# Patient Record
Sex: Male | Born: 1950
Health system: Southern US, Community
[De-identification: ages and names within clinical notes are randomized; demographics above are authoritative.]

## PROBLEM LIST (undated history)

## (undated) DIAGNOSIS — K635 Polyp of colon: Secondary | ICD-10-CM

## (undated) DIAGNOSIS — J45909 Unspecified asthma, uncomplicated: Secondary | ICD-10-CM

## (undated) DIAGNOSIS — J302 Other seasonal allergic rhinitis: Secondary | ICD-10-CM

## (undated) DIAGNOSIS — E291 Testicular hypofunction: Secondary | ICD-10-CM

## (undated) DIAGNOSIS — E669 Obesity, unspecified: Secondary | ICD-10-CM

## (undated) DIAGNOSIS — T148XXA Other injury of unspecified body region, initial encounter: Secondary | ICD-10-CM

## (undated) DIAGNOSIS — E559 Vitamin D deficiency, unspecified: Secondary | ICD-10-CM

## (undated) DIAGNOSIS — J309 Allergic rhinitis, unspecified: Secondary | ICD-10-CM

## (undated) DIAGNOSIS — N400 Enlarged prostate without lower urinary tract symptoms: Secondary | ICD-10-CM

## (undated) DIAGNOSIS — N529 Male erectile dysfunction, unspecified: Secondary | ICD-10-CM

## (undated) DIAGNOSIS — H905 Unspecified sensorineural hearing loss: Secondary | ICD-10-CM

## (undated) DIAGNOSIS — M8580 Other specified disorders of bone density and structure, unspecified site: Secondary | ICD-10-CM

## (undated) DIAGNOSIS — E785 Hyperlipidemia, unspecified: Secondary | ICD-10-CM

## (undated) DIAGNOSIS — G4730 Sleep apnea, unspecified: Secondary | ICD-10-CM

## (undated) DIAGNOSIS — I10 Essential (primary) hypertension: Secondary | ICD-10-CM

## (undated) DIAGNOSIS — T7840XA Allergy, unspecified, initial encounter: Secondary | ICD-10-CM

## (undated) HISTORY — PX: ACHILLES TENDON SURGERY: SHX542

## (undated) HISTORY — PX: TONSILLECTOMY: SUR1361

## (undated) HISTORY — PX: COLONOSCOPY: SHX174

## (undated) HISTORY — DX: Male erectile dysfunction, unspecified: N52.9

## (undated) HISTORY — PX: POLYPECTOMY: SHX149

## (undated) HISTORY — DX: Other injury of unspecified body region, initial encounter: T14.8XXA

## (undated) HISTORY — DX: Unspecified asthma, uncomplicated: J45.909

## (undated) HISTORY — DX: Other specified disorders of bone density and structure, unspecified site: M85.80

## (undated) HISTORY — DX: Unspecified sensorineural hearing loss: H90.5

## (undated) HISTORY — DX: Polyp of colon: K63.5

## (undated) HISTORY — DX: Hyperlipidemia, unspecified: E78.5

## (undated) HISTORY — DX: Sleep apnea, unspecified: G47.30

## (undated) HISTORY — DX: Other seasonal allergic rhinitis: J30.2

## (undated) HISTORY — DX: Benign prostatic hyperplasia without lower urinary tract symptoms: N40.0

## (undated) HISTORY — DX: Obesity, unspecified: E66.9

## (undated) HISTORY — DX: Allergy, unspecified, initial encounter: T78.40XA

## (undated) HISTORY — DX: Testicular hypofunction: E29.1

## (undated) HISTORY — DX: Vitamin D deficiency, unspecified: E55.9

## (undated) HISTORY — DX: Essential (primary) hypertension: I10

## (undated) HISTORY — DX: Allergic rhinitis, unspecified: J30.9

---

## 1996-07-02 HISTORY — PX: UMBILICAL HERNIA REPAIR: SHX196

## 1998-12-02 ENCOUNTER — Emergency Department (HOSPITAL_COMMUNITY): Admission: EM | Admit: 1998-12-02 | Discharge: 1998-12-02 | Payer: Self-pay | Admitting: Emergency Medicine

## 1999-03-27 ENCOUNTER — Ambulatory Visit: Admission: RE | Admit: 1999-03-27 | Discharge: 1999-03-27 | Payer: Self-pay | Admitting: Family Medicine

## 2007-10-21 ENCOUNTER — Ambulatory Visit (HOSPITAL_BASED_OUTPATIENT_CLINIC_OR_DEPARTMENT_OTHER): Admission: RE | Admit: 2007-10-21 | Discharge: 2007-10-21 | Payer: Self-pay | Admitting: *Deleted

## 2007-10-25 ENCOUNTER — Ambulatory Visit: Payer: Self-pay | Admitting: Internal Medicine

## 2009-10-10 ENCOUNTER — Ambulatory Visit (HOSPITAL_COMMUNITY): Admission: RE | Admit: 2009-10-10 | Discharge: 2009-10-10 | Payer: Self-pay | Admitting: Sports Medicine

## 2009-10-26 ENCOUNTER — Encounter: Admission: RE | Admit: 2009-10-26 | Discharge: 2009-10-26 | Payer: Self-pay | Admitting: Sports Medicine

## 2010-07-23 ENCOUNTER — Encounter: Payer: Self-pay | Admitting: Sports Medicine

## 2010-11-14 NOTE — Procedures (Signed)
NAME:  Eric Campbell, Eric Campbell               ACCOUNT NO.:  [id number]   MEDICAL RECORD NO.:  [id number]          PATIENT TYPE:  OUT   LOCATION:  SLEEP CENTER                 FACILITY:  [facility]   PHYSICIAN:  Clinton D. Maple Hudson, MD, FCCP, FACPDATE OF BIRTH:  07-17-1948   DATE OF STUDY:  10/21/2007                            NOCTURNAL POLYSOMNOGRAM   REFERRING PHYSICIAN:  Rosine Abe, M.D.   REFERRING PHYSICIAN:  Rosine Abe, M.D.   INDICATION FOR STUDY:  Hypersomnia with sleep apnea.   EPWORTH SLEEPINESS SCORE:  12/24.   BMI:  39.5.   WEIGHT:  260 pounds.   HEIGHT:  68 inches.   NECK:  18 inches.   MEDICATIONS:  Home medications charted and reviewed.   SLEEP ARCHITECTURE:  Split study protocol.  During the diagnostic phase,  total sleep time was 120 minutes with sleep efficiency 89.2%.  Stage 1  was 7.1%, stage 2 was 92.9%, stage 3 absent, REM absent.  Sleep latency  4 minutes.  Awake after sleep onset 10.5 minutes.  Arousal index 29.5.  Bedtime medication:  Ambien CR taken at 2145 hours.   RESPIRATORY DATA:  Split study protocol.  Apnea/hypopnea index (AHI) 47  per hour, indicating moderately severe obstructive sleep apnea/hypopnea  syndrome before CPAP.  Ninety-four events were counted, including 17  obstructive apneas, 5 central apneas, 3 mixed apneas, and 69 hypopneas  before CPAP.  Events were more common while supine but seen in all sleep  positions.  CPAP was titrated to 21 CWP.  Effective control was seen at  17 CWP, AHI 0 per hour.  The technician tried higher pressures and  briefly tried BiPAP with an inspiratory pressure of 23 over an  expiratory pressure of 19 in an effort to suppress snoring.  Best  pressure appeared to be 19 CWP with an AHI of 0 per hour and a well-  maintained oxygen saturation.  He chose a medium Mirage Quattro full-  face mask with heated humidifier.   OXYGEN DATA:  Very loud snoring with oxygen desaturation to a nadir of  83% before CPAP.   After CPAP control, saturation held 94.5% on room air.   CARDIAC DATA:  Sinus rhythm with occasional PVCs.   MOVEMENT-PARASOMNIA:  No significant movement disturbance.  Bathroom x1.   IMPRESSIONS-RECOMMENDATIONS:  1. Moderately severe obstructive sleep apnea/hypopnea syndrome, AHI 47      per hour.  A few central events were noted.  Events were more      frequent while supine as expected but not limited to supine sleep      position.  Very loud snoring with oxygen desaturation to a nadir of      83%.  2. Recommend initial home trial of CPAP at 19 CWP, which gave an AHI      of 0 per hour during this study.  He chose a medium ResMed  Quattro      full-face mask with heated humidifier.      Clinton D. Maple Hudson, MD, FCCP, FACP  Diplomate, Biomedical engineer of Sleep Medicine  Electronically Signed     CDY/MEDQ  D:  10/25/2007 08:38:53  T:  10/25/2007  16:10:96  Job:  [id number]

## 2011-08-23 ENCOUNTER — Other Ambulatory Visit: Payer: Self-pay | Admitting: Podiatry

## 2011-08-23 DIAGNOSIS — M7660 Achilles tendinitis, unspecified leg: Secondary | ICD-10-CM

## 2011-08-28 ENCOUNTER — Ambulatory Visit
Admission: RE | Admit: 2011-08-28 | Discharge: 2011-08-28 | Disposition: A | Discharge status: Home / Self Care | Payer: BC Managed Care – PPO | Source: Ambulatory Visit | Attending: Podiatry | Admitting: Podiatry

## 2011-08-28 DIAGNOSIS — M7660 Achilles tendinitis, unspecified leg: Secondary | ICD-10-CM

## 2011-09-07 ENCOUNTER — Ambulatory Visit (AMBULATORY_SURGERY_CENTER): Payer: BC Managed Care – PPO | Admitting: *Deleted

## 2011-09-07 ENCOUNTER — Encounter: Payer: Self-pay | Admitting: Gastroenterology

## 2011-09-07 VITALS — Ht 68.0 in | Wt 271.3 lb

## 2011-09-07 DIAGNOSIS — Z1211 Encounter for screening for malignant neoplasm of colon: Secondary | ICD-10-CM

## 2011-09-07 MED ORDER — PEG-KCL-NACL-NASULF-NA ASC-C 100 G PO SOLR: ORAL | Status: DC

## 2011-09-21 ENCOUNTER — Other Ambulatory Visit: Payer: Self-pay | Admitting: Gastroenterology

## 2011-10-24 ENCOUNTER — Encounter: Payer: Self-pay | Admitting: Gastroenterology

## 2011-10-24 ENCOUNTER — Ambulatory Visit (AMBULATORY_SURGERY_CENTER): Payer: BC Managed Care – PPO | Admitting: Gastroenterology

## 2011-10-24 VITALS — BP 135/71 | HR 61 | Temp 95.7°F | Resp 18 | Ht 68.0 in | Wt 271.0 lb

## 2011-10-24 DIAGNOSIS — D179 Benign lipomatous neoplasm, unspecified: Secondary | ICD-10-CM

## 2011-10-24 DIAGNOSIS — D126 Benign neoplasm of colon, unspecified: Secondary | ICD-10-CM

## 2011-10-24 DIAGNOSIS — Z1211 Encounter for screening for malignant neoplasm of colon: Secondary | ICD-10-CM

## 2011-10-24 DIAGNOSIS — D175 Benign lipomatous neoplasm of intra-abdominal organs: Secondary | ICD-10-CM

## 2011-10-24 MED ORDER — SODIUM CHLORIDE 0.9 % IV SOLN: 500.0000 mL | INTRAVENOUS | Status: DC

## 2011-10-24 NOTE — Progress Notes (Signed)
Patient did not experience any of the following events: a burn prior to discharge; a fall within the facility; wrong site/side/patient/procedure/implant event; or a hospital transfer or hospital admission upon discharge from the facility. (G8907) Patient did not have preoperative order for IV antibiotic SSI prophylaxis. (G8918)  

## 2011-10-24 NOTE — Patient Instructions (Signed)

## 2011-10-24 NOTE — Op Note (Signed)
[facility] [address] [place], [place]  [address]  COLONOSCOPY PROCEDURE REPORT PATIENT:  Eric Campbell, Eric Campbell  MR#:  [id number] BIRTHDATE:  11-23-1950, 60 yrs. old  GENDER:  male ENDOSCOPIST:  Judie Petit T. Russella Dar, MD, [facility] Referred by:  Tally Joe, M.D. PROCEDURE DATE:  10/24/2011 PROCEDURE:  Colonoscopy with biopsy ASA CLASS:  Class II INDICATIONS:  1) Routine Risk Screening MEDICATIONS:   MAC sedation, administered by CRNA, propofol (Diprivan) 200 mg IV DESCRIPTION OF PROCEDURE:   After the risks benefits and alternatives of the procedure were thoroughly explained, informed consent was obtained.  Digital rectal exam was performed and revealed no abnormalities.   The LB CF-[id number] endoscope was introduced through the anus and advanced to the cecum, which was identified by both the appendix and ileocecal valve, without limitations.  The quality of the prep was good, using MoviPrep. The instrument was then slowly withdrawn as the colon was fully examined. <<PROCEDUREIMAGES>> FINDINGS:  A lipoma was found at the hepatic flexure. It was soft, smooth and submucosal. It was 1.5 cm in size. Multiple biopsies were obtained and sent to pathology.  A sessile polyp was found in the descending colon. It was 5 mm in size. The polyp was removed using cold biopsy forceps.  Mild diverticulosis was found in the sigmoid colon.  Otherwise normal colonoscopy without other polyps, masses, vascular ectasias, or inflammatory changes.   Retroflexed views in the rectum revealed no abnormalities.    The time to cecum =  1.25  minutes. The scope was then withdrawn (time = 11.25  min) from the patient and the procedure completed.  COMPLICATIONS:  None  ENDOSCOPIC IMPRESSION: 1) 1.5 cm lipoma at the hepatic flexure 2) 5 mm sessile polyp in the descending colon 3) Mild diverticulosis in the sigmoid colon  RECOMMENDATIONS: 1) Await pathology results 2) High fiber diet with liberal fluid  intake. 3) If the polyp is adenomatous (pre-cancerous), repeat colonoscopy in 5 years. Otherwise follow colorectal cancer screening guidelines for "routine risk" patients with colonoscopy in 10 years.  Venita Lick. Russella Dar, MD, Clementeen Graham  n. eSIGNED:   Venita Lick. Charmion Hapke at 10/24/2011 09:54 AM  Lyman Bishop, Windy Fast, [phone]

## 2011-10-25 ENCOUNTER — Telehealth: Payer: Self-pay

## 2011-10-25 NOTE — Telephone Encounter (Signed)
Left message on answering machine. 

## 2011-10-29 ENCOUNTER — Encounter: Payer: Self-pay | Admitting: Gastroenterology

## 2014-10-01 ENCOUNTER — Institutional Professional Consult (permissible substitution): Payer: Self-pay | Admitting: Cardiology

## 2014-10-05 ENCOUNTER — Other Ambulatory Visit: Payer: Self-pay

## 2014-10-07 ENCOUNTER — Ambulatory Visit (INDEPENDENT_AMBULATORY_CARE_PROVIDER_SITE_OTHER): Payer: 59 | Admitting: Cardiology

## 2014-10-07 ENCOUNTER — Encounter: Payer: Self-pay | Admitting: Cardiology

## 2014-10-07 VITALS — BP 124/80 | HR 78 | Ht 68.0 in | Wt 265.1 lb

## 2014-10-07 DIAGNOSIS — E669 Obesity, unspecified: Secondary | ICD-10-CM | POA: Diagnosis not present

## 2014-10-07 DIAGNOSIS — G4733 Obstructive sleep apnea (adult) (pediatric): Secondary | ICD-10-CM

## 2014-10-07 DIAGNOSIS — G479 Sleep disorder, unspecified: Secondary | ICD-10-CM | POA: Diagnosis not present

## 2014-10-07 DIAGNOSIS — R351 Nocturia: Secondary | ICD-10-CM | POA: Insufficient documentation

## 2014-10-07 DIAGNOSIS — Z6837 Body mass index (BMI) 37.0-37.9, adult: Secondary | ICD-10-CM | POA: Insufficient documentation

## 2014-10-07 NOTE — Patient Instructions (Addendum)
Your physician wants you to follow-up in: 6 months with Dr Radford Pax. (October 2016).  You will receive a reminder letter in the mail two months in advance. If you don't receive a letter, please call our office to schedule the follow-up appointment.   You have been referred to [facility].

## 2014-10-07 NOTE — Progress Notes (Signed)
Cardiology Office Note   Date:  10/07/2014   ID:  [name], DOB [birth year], MRN [id number]  PCP:  Gara Kroner, MD    Chief Complaint  Patient presents with  . Sleep Apnea  . Obesity      History of Present Illness: Eric Campbell is a 64 y.o. male who presents for followup of his OSA. He has a history of OSA and is on CPAP. He uses the nasal pillow mask which he tolerates well.  He does not use a chin strap.  He uses [facility] for his supplies.  He only gets 2-3 hours nightly.  He wakes up a lot during the night every 3 hours to go to the bathroom and sometimes has a hard time getting back to sleep.  He feels sleepy some in the am but he says that he is so used to this routine that he does not have lot of fatigue during the day.  If he is off from work he will nap during the day.  He is at 19cm H2O on his device.  He has some problems with mouth dryness.  He does not think that he snores.  He does not get any type of aerobic exercise due to lack of time.  He works 12 hour days.      Past Medical History  Diagnosis Date  . Hyperlipidemia   . Seasonal allergies   . Sleep apnea     cpap  . Diabetes mellitus     diet controlled  . Torn ligament     left ankle  . Asthma   . Sleep apnea   . Asthma   . Dyslipidemia   . Congenital hearing loss   . Obesity   . ED (erectile dysfunction)   . Vitamin D deficiency   . Hypogonadism male   . BPH (benign prostatic hyperplasia)     Past Surgical History  Procedure Laterality Date  . Umbilical hernia repair  1998     Current Outpatient Prescriptions  Medication Sig Dispense Refill  . albuterol (PROVENTIL HFA;VENTOLIN HFA) 108 (90 BASE) MCG/ACT inhaler Inhale 2 puffs into the lungs every 4 (four) hours as needed for wheezing or shortness of breath.    Marland Kitchen aspirin 81 MG tablet Take 81 mg by mouth daily.    Marland Kitchen atorvastatin (LIPITOR) 20 MG tablet Take 20 mg by mouth 1 day or 1 dose.    . Calcium Carbonate 1500 (600 CA) MG TABS  Take by mouth.    . losartan (COZAAR) 25 MG tablet Take 25 mg by mouth daily.    . metFORMIN (GLUCOPHAGE) 500 MG tablet Take 500 mg by mouth 2 (two) times daily with a meal.    . sildenafil (VIAGRA) 100 MG tablet Take 100 mg by mouth daily as needed for erectile dysfunction.    . TESTIM 50 MG/5GM (1%) GEL Apply 2 application topically daily.     No current facility-administered medications for this visit.    Allergies:   Penicillins and Lotrisone    Social History:  The patient  reports that he has never smoked. He has never used smokeless tobacco. He reports that he does not drink alcohol or use illicit drugs.   Family History:  The patient's family history includes Pancreatic cancer (age of onset: 1) in his mother. There is no history of Colon cancer, Stomach cancer, or Rectal cancer.    ROS:  Please see the history of present illness.   Otherwise,  review of systems are positive for none.   All other systems are reviewed and negative.    PHYSICAL EXAM: VS:  BP 124/80 mmHg  Pulse 78  Ht 5\' 8"  (1.727 m)  Wt 265 lb 1.9 oz (120.258 kg)  BMI 40.32 kg/m2  SpO2 93% , BMI Body mass index is 40.32 kg/(m^2). GEN: Well nourished, well developed, in no acute distress HEENT: normal Neck: no JVD, carotid bruits, or masses Cardiac: RRR; no murmurs, rubs, or gallops,no edema  Respiratory:  clear to auscultation bilaterally, normal work of breathing GI: soft, nontender, nondistended, + BS MS: no deformity or atrophy Skin: warm and dry, no rash Neuro:  Strength and sensation are intact Psych: euthymic mood, full affect   EKG:  EKG is not ordered today.    Recent Labs: No results found for requested labs within last 365 days.    Lipid Panel No results found for: CHOL, TRIG, HDL, CHOLHDL, VLDL, LDLCALC, LDLDIRECT    Wt Readings from Last 3 Encounters:  10/07/14 265 lb 1.9 oz (120.258 kg)  10/24/11 271 lb (122.925 kg)  09/07/11 271 lb 4.8 oz (123.061 kg)       ASSESSMENT AND  PLAN:  1.  Obstructive sleep apnea on CPAP at 19cm H2O.  He tolerates it well but is awakening a lot at night .  I will get a download from his DME to make sure OSA is adequately treated. 2.  Obesity - I have encouraged him to try to get into some type of routine exercise program and get into a routine diet.   3.  Sleep maintenance disorder that sounds mainly like issues with his prostate.  He is having to get up a lot at night to urinate.  I will refer him to urology.   4.  Nocturia   Current medicines are reviewed at length with the patient today.  The patient does not have concerns regarding medicines.  The following changes have been made:  no change  Labs/ tests ordered today include: see above assessment and plan  Orders Placed This Encounter  Procedures  . Ambulatory referral to Urology     Disposition:   FU with  me in 6 months   Signed, Sueanne Margarita, MD  10/07/2014 1:30 PM    [facility] [place], [place],   [address] Phone: [phone]; Fax: [phone]

## 2014-10-20 ENCOUNTER — Encounter: Payer: Self-pay | Admitting: Cardiology

## 2014-10-29 ENCOUNTER — Telehealth: Payer: Self-pay | Admitting: Cardiology

## 2014-10-29 NOTE — Telephone Encounter (Signed)
New Message    Patient needs a call back about forms that were sent to the office for medical equipment. Please give patient a call back.

## 2014-10-29 NOTE — Telephone Encounter (Signed)
Patient concerned because [facility] told him that paperwork had been faxed over twice for Dr. Theodosia Blender signature.  Informed patient that paperwork was not received, but [facility] would be called to follow-up. Patient requests call from [facility] once I speak with them.  Called [facility] and spoke with Ivin Booty. They had the incorrect fax number. Provided correct fax number and Ivin Booty st she will send the paperwork.  Instructed her to call patient to schedule appointment for next week. Cleta Alberts for her help.

## 2014-11-02 ENCOUNTER — Telehealth: Payer: Self-pay | Admitting: Cardiology

## 2014-11-02 NOTE — Telephone Encounter (Signed)
New Message   Follow up on the form that was faxed on Thursday 11/02/2014 . Making sure that it was signed and faxed back for CPAP supplies.

## 2014-11-02 NOTE — Telephone Encounter (Signed)
Informed Eric Campbell that paper work to be signed was received this morning, but Dr. Radford Pax is not here to sign. Informed her that paperwork will be sent ASAP.

## 2014-11-30 ENCOUNTER — Ambulatory Visit: Payer: 59 | Admitting: Podiatry

## 2014-12-14 ENCOUNTER — Ambulatory Visit: Payer: 59 | Admitting: Podiatry

## 2014-12-28 ENCOUNTER — Encounter: Payer: Self-pay | Admitting: Podiatry

## 2014-12-28 ENCOUNTER — Ambulatory Visit (INDEPENDENT_AMBULATORY_CARE_PROVIDER_SITE_OTHER): Payer: 59

## 2014-12-28 ENCOUNTER — Ambulatory Visit (INDEPENDENT_AMBULATORY_CARE_PROVIDER_SITE_OTHER): Payer: 59 | Admitting: Podiatry

## 2014-12-28 DIAGNOSIS — E1142 Type 2 diabetes mellitus with diabetic polyneuropathy: Secondary | ICD-10-CM | POA: Diagnosis not present

## 2014-12-28 DIAGNOSIS — M778 Other enthesopathies, not elsewhere classified: Secondary | ICD-10-CM

## 2014-12-28 DIAGNOSIS — R52 Pain, unspecified: Secondary | ICD-10-CM

## 2014-12-28 DIAGNOSIS — L600 Ingrowing nail: Secondary | ICD-10-CM | POA: Diagnosis not present

## 2014-12-28 DIAGNOSIS — M7751 Other enthesopathy of right foot: Secondary | ICD-10-CM | POA: Diagnosis not present

## 2014-12-28 DIAGNOSIS — M779 Enthesopathy, unspecified: Secondary | ICD-10-CM

## 2014-12-28 NOTE — Progress Notes (Signed)
He presents today with a chief complaint of a painful first metatarsophalangeal joint right foot. He denies any trauma. He does relate that he has started to develop some odd sensations to both great toes bilaterally he denies any problems sleeping at night however he is also complaining of pain along the tibial border of the hallux right. He relates he is recently started a new diabetic medication. He states that his blood sugars have not been very good.  Objective: Vital signs are stable he is alert and oriented 3 pulses are palpable bilateral. Neurologic sensorium is diminished per Semmes-Weinstein monofilament. Deep tendon reflexes are intact bilateral muscle strength equal bilaterally. Orthopedic evaluation demonstrates all joints distal to the ankle range of motion without crepitation. With exception of the first metatarsophalangeal joint of the right foot. Radiographs demonstrate joint space narrowing and subchondral sclerosis and dorsal spurring. There is pain on dorsiflexion. Sharp incurvated nail margin along the tibial border of the hallux right which does not appear to be complicated by infection at this point.  Assessment: Diabetic peripheral neuropathy with capsulitis and hallux limitus first metatarsophalangeal joint right foot. Ingrown nail hallux right.  Plan: Discussed etiology pathology conservative versus surgical therapies. At this point after sterile Betadine skin prep I injected the first metatarsophalangeal joint of the right foot. I debrided his hallux nail without complications. More than likely he will need to start Lyrica or gabapentin in the near future. I will follow-up with him in a few weeks to make sure he is doing better.

## 2015-02-02 ENCOUNTER — Encounter: Payer: Self-pay | Admitting: Cardiology

## 2015-05-31 ENCOUNTER — Ambulatory Visit (INDEPENDENT_AMBULATORY_CARE_PROVIDER_SITE_OTHER): Payer: 59 | Admitting: Podiatry

## 2015-05-31 ENCOUNTER — Encounter: Payer: Self-pay | Admitting: Podiatry

## 2015-05-31 VITALS — BP 131/67 | HR 83 | Resp 16

## 2015-05-31 DIAGNOSIS — L600 Ingrowing nail: Secondary | ICD-10-CM | POA: Diagnosis not present

## 2015-05-31 MED ORDER — NEOMYCIN-POLYMYXIN-HC 3.5-10000-1 OT SOLN: OTIC | Status: DC

## 2015-05-31 NOTE — Patient Instructions (Signed)

## 2015-05-31 NOTE — Progress Notes (Signed)
He presents today with a chief complaint of a painful ingrown toenail tibial border of the hallux right and present for some quite some time. He states that he is followed and his blood sugar down low enough that we can perform a procedure.  Objective evaluation reveals vital signs stable alert and oriented 3. Hemoglobin A1c is a 6.9%. Pulses are strongly palpable right foot. Neurologic sensorium is intact. Deep tendon reflexes are intact. Muscle strength is 5 over 5 dorsiflexion plantar flexors and inverters everters on his musculature is intact. Orthopedic evaluation demonstrates no changes from previous findings of them worsening of the first metatarsophalangeal joint osteoarthritic change. Cutaneous evaluation demonstrated erythema and edema with sharp incurvated nail margin tibial border of the hallux right.  Assessment: Ingrown nail paronychia abscess hallux right. Diabetes mellitus with diabetic peripheral neuropathy. 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  Plan: Discussed etiology pathology conservative versus surgical therapies. At this point we performed a chemical matrixectomy to the tibial border of the hallux right. He tolerated this procedure well today and we discussed the need for surgical intervention regarding the osteoarthritis of the first metatarsophalangeal joint. He was given both oral and home going instructions for care and soaking of his right toe as well as a prescription for her for an otic to be applied twice a day after soaking. I will follow-up with him in 1 week.

## 2015-06-06 ENCOUNTER — Encounter: Payer: Self-pay | Admitting: Podiatry

## 2015-06-06 ENCOUNTER — Ambulatory Visit (INDEPENDENT_AMBULATORY_CARE_PROVIDER_SITE_OTHER): Payer: 59 | Admitting: Podiatry

## 2015-06-06 DIAGNOSIS — L600 Ingrowing nail: Secondary | ICD-10-CM

## 2015-06-06 DIAGNOSIS — L03031 Cellulitis of right toe: Secondary | ICD-10-CM

## 2015-06-06 DIAGNOSIS — L03011 Cellulitis of right finger: Secondary | ICD-10-CM

## 2015-06-06 MED ORDER — CLINDAMYCIN HCL 150 MG PO CAPS: 150.0000 mg | ORAL_CAPSULE | Freq: Three times a day (TID) | ORAL | Status: DC

## 2015-06-06 NOTE — Patient Instructions (Signed)

## 2015-06-06 NOTE — Progress Notes (Signed)
He presents today 1 week status post matrixectomy hallux right. He states that his continues to soak on a regular basis in the toe is a little bit red. He denies fever chills nausea vomiting muscle pains.  Objective: Vital signs stable alert and oriented 3. Pulses are strongly palpable. Neurologic sensorium is intact. Deep tendon reflexes are intact. Mild erythema along the proximal nail fold of the hallux right no purulence and no malodor.  Assessment: Well-healing matrixectomy hallux right. Mild paronychia.   Plan: Started him on clindamycin 300 mg 1 by mouth 3 times a day.

## 2015-06-07 ENCOUNTER — Ambulatory Visit: Payer: 59 | Admitting: Podiatry

## 2015-06-12 NOTE — Progress Notes (Signed)
Cardiology Office Note   Date:  06/13/2015   ID:  [name], DOB [birth year], MRN [id number]  PCP:  Gara Kroner, MD    Chief Complaint  Patient presents with  . Sleep Apnea      History of Present Illness: Eric Campbell is a 64 y.o. male who presents for followup of his OSA. He has a history of OSA and is on CPAP. He uses the nasal pillow mask which he tolerates well. He does not use a chin strap.He would like to get a new CPAP machine.  He only gets 2-3 hours nightly. He wakes up a lot during the night every 3 hours to go to the bathroom and sometimes has a hard time getting back to sleep. He feels sleepy some in the am but he says that he is so used to this routine that he does not have lot of fatigue during the day. If he is off from work he will nap during the day. He is at 19cm H2O on his device. He has some problems with mouth dryness. He does not think that he snores. He does not get any type of aerobic exercise due to lack of time. He works 12 hour days.      Past Medical History  Diagnosis Date  . Hyperlipidemia   . Seasonal allergies   . Sleep apnea     cpap  . Diabetes mellitus     diet controlled  . Torn ligament     left ankle  . Asthma   . Sleep apnea   . Asthma   . Dyslipidemia   . Congenital hearing loss   . Obesity   . ED (erectile dysfunction)   . Vitamin D deficiency   . Hypogonadism male   . BPH (benign prostatic hyperplasia)     Past Surgical History  Procedure Laterality Date  . Umbilical hernia repair  1998     Current Outpatient Prescriptions  Medication Sig Dispense Refill  . albuterol (PROVENTIL HFA;VENTOLIN HFA) 108 (90 BASE) MCG/ACT inhaler Inhale 2 puffs into the lungs every 4 (four) hours as needed for wheezing or shortness of breath.    Marland Kitchen aspirin 81 MG tablet Take 81 mg by mouth daily.    Marland Kitchen atorvastatin (LIPITOR) 20 MG tablet Take 20 mg by mouth 1 day or 1 dose.    . Calcium Carbonate 1500  (600 CA) MG TABS Take by mouth.    . clindamycin (CLEOCIN) 150 MG capsule Take 1 capsule (150 mg total) by mouth 3 (three) times daily. 30 capsule 0  . losartan (COZAAR) 25 MG tablet Take 25 mg by mouth daily.    . metFORMIN (GLUCOPHAGE) 500 MG tablet Take 500 mg by mouth 2 (two) times daily with a meal.    . neomycin-polymyxin-hydrocortisone (CORTISPORIN) otic solution Apply one to two drops to toe after soaking twice daily. 10 mL 0  . sildenafil (VIAGRA) 100 MG tablet Take 100 mg by mouth daily as needed for erectile dysfunction.    . TESTIM 50 MG/5GM (1%) GEL Apply 2 application topically daily.     No current facility-administered medications for this visit.    Allergies:   Penicillins and Lotrisone    Social History:  The patient  reports that he has never smoked. He has never used smokeless tobacco. He reports that he does not drink alcohol or use illicit drugs.  Family History:  The patient's family history includes Pancreatic cancer (age of onset: 65) in his mother. There is no history of Colon cancer, Stomach cancer, or Rectal cancer.    ROS:  Please see the history of present illness.   Otherwise, review of systems are positive for none.   All other systems are reviewed and negative.    PHYSICAL EXAM: VS:  BP 128/78 mmHg  Pulse 68  Ht 5\' 8"  (1.727 m)  Wt 268 lb (121.564 kg)  BMI 40.76 kg/m2 , BMI Body mass index is 40.76 kg/(m^2). GEN: Well nourished, well developed, in no acute distress HEENT: normal Neck: no JVD, carotid bruits, or masses Cardiac: RRR; no murmurs, rubs, or gallops,no edema  Respiratory:  clear to auscultation bilaterally, normal work of breathing GI: soft, nontender, nondistended, + BS MS: no deformity or atrophy Skin: warm and dry, no rash Neuro:  Strength and sensation are intact Psych: euthymic mood, full affect   EKG:  EKG is not ordered today.    Recent Labs: No results found for requested labs within last 365 days.    Lipid Panel No  results found for: CHOL, TRIG, HDL, CHOLHDL, VLDL, LDLCALC, LDLDIRECT    Wt Readings from Last 3 Encounters:  06/13/15 268 lb (121.564 kg)  10/07/14 265 lb 1.9 oz (120.258 kg)  10/24/11 271 lb (122.925 kg)     ASSESSMENT AND PLAN:  1. Obstructive sleep apnea on CPAP at 19cm H2O. He tolerates it well but is awakening a lot at night . I will get a download from his DME to make sure OSA is adequately treated. I will also order a new machine since his is over 38 years old.  2. Obesity - I have encouraged him to try to get into some type of routine exercise program and get into a routine diet.      Current medicines are reviewed at length with the patient today.  The patient does not have concerns regarding medicines.  The following changes have been made:  no change  Labs/ tests ordered today: See above Assessment and Plan No orders of the defined types were placed in this encounter.     Disposition:   FU with me in 1 year  Signed, Sueanne Margarita, MD  06/13/2015 8:36 AM    [facility] [place], [place], [place]  [address] Phone: [phone]; Fax: [phone]

## 2015-06-13 ENCOUNTER — Ambulatory Visit: Payer: 59 | Admitting: Podiatry

## 2015-06-13 ENCOUNTER — Encounter: Payer: Self-pay | Admitting: Cardiology

## 2015-06-13 ENCOUNTER — Ambulatory Visit (INDEPENDENT_AMBULATORY_CARE_PROVIDER_SITE_OTHER): Payer: 59 | Admitting: Cardiology

## 2015-06-13 VITALS — BP 128/78 | HR 68 | Ht 68.0 in | Wt 268.0 lb

## 2015-06-13 DIAGNOSIS — G4733 Obstructive sleep apnea (adult) (pediatric): Secondary | ICD-10-CM | POA: Diagnosis not present

## 2015-06-13 DIAGNOSIS — E669 Obesity, unspecified: Secondary | ICD-10-CM | POA: Diagnosis not present

## 2015-06-13 NOTE — Patient Instructions (Signed)
Medication Instructions:  Your physician recommends that you continue on your current medications as directed. Please refer to the Current Medication list given to you today.   Labwork: None Ordered   Testing/Procedures: Talbert Cage, CMA is submitting the order for the new CPAP Her phone number is [phone]   Follow-Up: Your physician wants you to follow-up in: 1 year with Dr. Radford Pax.  You will receive a reminder letter in the mail two months in advance. If you don't receive a letter, please call our office to schedule the follow-up appointment.   If you need a refill on your cardiac medications before your next appointment, please call your pharmacy.

## 2015-06-13 NOTE — Addendum Note (Signed)
Addended by: Andres Ege on: 06/13/2015 03:59 PM   Modules accepted: Orders

## 2015-06-20 ENCOUNTER — Ambulatory Visit: Payer: 59 | Admitting: Podiatry

## 2015-07-22 DIAGNOSIS — G4733 Obstructive sleep apnea (adult) (pediatric): Secondary | ICD-10-CM | POA: Diagnosis not present

## 2015-07-27 DIAGNOSIS — L986 Other infiltrative disorders of the skin and subcutaneous tissue: Secondary | ICD-10-CM | POA: Diagnosis not present

## 2015-07-27 DIAGNOSIS — D485 Neoplasm of uncertain behavior of skin: Secondary | ICD-10-CM | POA: Diagnosis not present

## 2015-08-22 DIAGNOSIS — G4733 Obstructive sleep apnea (adult) (pediatric): Secondary | ICD-10-CM | POA: Diagnosis not present

## 2015-08-23 DIAGNOSIS — E291 Testicular hypofunction: Secondary | ICD-10-CM | POA: Diagnosis not present

## 2015-08-23 DIAGNOSIS — M8580 Other specified disorders of bone density and structure, unspecified site: Secondary | ICD-10-CM | POA: Diagnosis not present

## 2015-08-23 DIAGNOSIS — E559 Vitamin D deficiency, unspecified: Secondary | ICD-10-CM | POA: Diagnosis not present

## 2015-08-23 DIAGNOSIS — G4730 Sleep apnea, unspecified: Secondary | ICD-10-CM | POA: Diagnosis not present

## 2015-08-23 DIAGNOSIS — Z7984 Long term (current) use of oral hypoglycemic drugs: Secondary | ICD-10-CM | POA: Diagnosis not present

## 2015-08-23 DIAGNOSIS — E782 Mixed hyperlipidemia: Secondary | ICD-10-CM | POA: Diagnosis not present

## 2015-08-23 DIAGNOSIS — E1165 Type 2 diabetes mellitus with hyperglycemia: Secondary | ICD-10-CM | POA: Diagnosis not present

## 2015-08-23 DIAGNOSIS — J45909 Unspecified asthma, uncomplicated: Secondary | ICD-10-CM | POA: Diagnosis not present

## 2015-08-23 DIAGNOSIS — G4700 Insomnia, unspecified: Secondary | ICD-10-CM | POA: Diagnosis not present

## 2015-08-23 DIAGNOSIS — J309 Allergic rhinitis, unspecified: Secondary | ICD-10-CM | POA: Diagnosis not present

## 2015-08-23 DIAGNOSIS — N529 Male erectile dysfunction, unspecified: Secondary | ICD-10-CM | POA: Diagnosis not present

## 2015-08-30 ENCOUNTER — Encounter: Payer: Self-pay | Admitting: Cardiology

## 2015-09-19 DIAGNOSIS — G4733 Obstructive sleep apnea (adult) (pediatric): Secondary | ICD-10-CM | POA: Diagnosis not present

## 2015-10-17 ENCOUNTER — Encounter: Payer: Self-pay | Admitting: Cardiology

## 2015-10-17 ENCOUNTER — Ambulatory Visit (INDEPENDENT_AMBULATORY_CARE_PROVIDER_SITE_OTHER): Payer: PPO | Admitting: Cardiology

## 2015-10-17 VITALS — BP 126/84 | HR 68 | Ht 68.5 in | Wt 267.1 lb

## 2015-10-17 DIAGNOSIS — E669 Obesity, unspecified: Secondary | ICD-10-CM

## 2015-10-17 DIAGNOSIS — G4733 Obstructive sleep apnea (adult) (pediatric): Secondary | ICD-10-CM

## 2015-10-17 NOTE — Progress Notes (Signed)
Cardiology Office Note    Date:  10/17/2015   ID:  Eric Campbell, DOB 10/21/1950, MRN [id number]  PCP:  Gara Kroner, MD  Cardiologist:  Sueanne Margarita, MD   Chief Complaint  Patient presents with  . Sleep Apnea    History of Present Illness:  Eric Campbell is a 65 y.o. male who presents for followup of his OSA. He has a history of OSA and is on CPAP. He uses the nasal pillow mask which he tolerates well.  He feels the pressure is good.  He is getting about 7 hours of sleep a night but gets up some to go to the bathroom.   If he gets 5 hours of sleep he says he feels rested in the am.  He occasionally naps during the day and uses his CPAP for naps.  He is at 18cm H2O on his device. He has some problems with mouth dryness but much improved on his new CPAP device. He does not think that he snores. He just started exercising at the gym because his HbA1C was elevated.  Past Medical History  Diagnosis Date  . Hyperlipidemia   . Seasonal allergies   . Sleep apnea     cpap  . Diabetes mellitus     diet controlled  . Torn ligament     left ankle  . Asthma   . Sleep apnea   . Asthma   . Dyslipidemia   . Congenital hearing loss   . Obesity   . ED (erectile dysfunction)   . Vitamin D deficiency   . Hypogonadism male   . BPH (benign prostatic hyperplasia)     Past Surgical History  Procedure Laterality Date  . Umbilical hernia repair  1998    Current Medications: Outpatient Prescriptions Prior to Visit  Medication Sig Dispense Refill  . albuterol (PROVENTIL HFA;VENTOLIN HFA) 108 (90 BASE) MCG/ACT inhaler Inhale 2 puffs into the lungs every 4 (four) hours as needed for wheezing or shortness of breath.    Marland Kitchen aspirin 81 MG tablet Take 81 mg by mouth daily.    Marland Kitchen atorvastatin (LIPITOR) 20 MG tablet Take 20 mg by mouth daily.     . Calcium Carbonate 1500 (600 CA) MG TABS Take 1 tablet by mouth daily.     Marland Kitchen losartan (COZAAR) 25 MG tablet Take 25 mg by mouth daily.    .  metFORMIN (GLUCOPHAGE) 500 MG tablet Take 500 mg by mouth 2 (two) times daily with a meal.    . sildenafil (VIAGRA) 100 MG tablet Take 100 mg by mouth daily as needed for erectile dysfunction.    . TESTIM 50 MG/5GM (1%) GEL Apply 2 application topically daily.    . clindamycin (CLEOCIN) 150 MG capsule Take 1 capsule (150 mg total) by mouth 3 (three) times daily. (Patient not taking: Reported on 10/17/2015) 30 capsule 0  . neomycin-polymyxin-hydrocortisone (CORTISPORIN) otic solution Apply one to two drops to toe after soaking twice daily. (Patient not taking: Reported on 10/17/2015) 10 mL 0   No facility-administered medications prior to visit.     Allergies:   Penicillins and Lotrisone   Social History   Social History  . Marital Status: Married    Spouse Name: donna  . Number of Children: 3  . Years of Education: college   Occupational History  . [employer]    Social History Main Topics  . Smoking status: Never Smoker   . Smokeless tobacco: Never Used  .  Alcohol Use: No  . Drug Use: No  . Sexual Activity: Not Asked   Other Topics Concern  . None   Social History Narrative   Tobacco use cigarettes: Never smoked. Tobacco history last upated 10/27/2013 no smoking Alcohol : Rare>> No Recreational drug use.     Family History:  The patient's family history includes Pancreatic cancer (age of onset: 55) in his mother. There is no history of Colon cancer, Stomach cancer, or Rectal cancer.   ROS:   Please see the history of present illness.    ROS All other systems reviewed and are negative.   PHYSICAL EXAM:   VS:  BP 126/84 mmHg  Pulse 68  Ht 5' 8.5" (1.74 m)  Wt 267 lb 1.9 oz (121.165 kg)  BMI 40.02 kg/m2   GEN: Well nourished, well developed, in no acute distress HEENT: normal Neck: no JVD, carotid bruits, or masses Cardiac: RRR; no murmurs, rubs, or gallops,no edema.  Intact distal pulses bilaterally.  Respiratory:  clear to auscultation bilaterally, normal  work of breathing GI: soft, nontender, nondistended, + BS MS: no deformity or atrophy Skin: warm and dry, no rash Neuro:  Alert and Oriented x 3, Strength and sensation are intact Psych: euthymic mood, full affect  Wt Readings from Last 3 Encounters:  10/17/15 267 lb 1.9 oz (121.165 kg)  06/13/15 268 lb (121.564 kg)  10/07/14 265 lb 1.9 oz (120.258 kg)      Studies/Labs Reviewed:   EKG:  EKG is not ordered today.   Recent Labs: No results found for requested labs within last 365 days.   Lipid Panel No results found for: CHOL, TRIG, HDL, CHOLHDL, VLDL, LDLCALC, LDLDIRECT  Additional studies/ records that were reviewed today include:  CPAP d/l    ASSESSMENT:    1. OSA (obstructive sleep apnea)   2. Obesity (BMI 30-39.9)      PLAN:  In order of problems listed above:  1. OSA - he has gotten a new CPAP machine which he is tolerating well.  His d.l today showed an AHI of 2.9/hrn on 18cm H2O and 90% compliant in using more than 4 hours nightly.  He will continue on current CPAP settings.  2. Obesity - I encouraged him to get into a routine exercise program and cut back on carbs and portions.      Medication Adjustments/Labs and Tests Ordered: Current medicines are reviewed at length with the patient today.  Concerns regarding medicines are outlined above.  Medication changes, Labs and Tests ordered today are listed in the Patient Instructions below. There are no Patient Instructions on file for this visit.   Lurena Nida, MD  10/17/2015 9:51 AM    [address], [place], [place]  [address] Phone: [phone]; Fax: [phone]

## 2015-10-17 NOTE — Patient Instructions (Signed)

## 2015-10-20 DIAGNOSIS — G4733 Obstructive sleep apnea (adult) (pediatric): Secondary | ICD-10-CM | POA: Diagnosis not present

## 2015-10-28 ENCOUNTER — Encounter: Payer: Self-pay | Admitting: Cardiology

## 2015-11-19 DIAGNOSIS — G4733 Obstructive sleep apnea (adult) (pediatric): Secondary | ICD-10-CM | POA: Diagnosis not present

## 2015-12-02 DIAGNOSIS — H2513 Age-related nuclear cataract, bilateral: Secondary | ICD-10-CM | POA: Diagnosis not present

## 2015-12-02 DIAGNOSIS — H52203 Unspecified astigmatism, bilateral: Secondary | ICD-10-CM | POA: Diagnosis not present

## 2015-12-02 DIAGNOSIS — E119 Type 2 diabetes mellitus without complications: Secondary | ICD-10-CM | POA: Diagnosis not present

## 2015-12-20 DIAGNOSIS — G4733 Obstructive sleep apnea (adult) (pediatric): Secondary | ICD-10-CM | POA: Diagnosis not present

## 2015-12-26 DIAGNOSIS — E1165 Type 2 diabetes mellitus with hyperglycemia: Secondary | ICD-10-CM | POA: Diagnosis not present

## 2015-12-26 DIAGNOSIS — J45909 Unspecified asthma, uncomplicated: Secondary | ICD-10-CM | POA: Diagnosis not present

## 2015-12-26 DIAGNOSIS — E782 Mixed hyperlipidemia: Secondary | ICD-10-CM | POA: Diagnosis not present

## 2015-12-26 DIAGNOSIS — Z1389 Encounter for screening for other disorder: Secondary | ICD-10-CM | POA: Diagnosis not present

## 2015-12-26 DIAGNOSIS — G4730 Sleep apnea, unspecified: Secondary | ICD-10-CM | POA: Diagnosis not present

## 2015-12-26 DIAGNOSIS — G4700 Insomnia, unspecified: Secondary | ICD-10-CM | POA: Diagnosis not present

## 2015-12-26 DIAGNOSIS — M8580 Other specified disorders of bone density and structure, unspecified site: Secondary | ICD-10-CM | POA: Diagnosis not present

## 2015-12-26 DIAGNOSIS — E559 Vitamin D deficiency, unspecified: Secondary | ICD-10-CM | POA: Diagnosis not present

## 2015-12-26 DIAGNOSIS — E291 Testicular hypofunction: Secondary | ICD-10-CM | POA: Diagnosis not present

## 2015-12-26 DIAGNOSIS — J309 Allergic rhinitis, unspecified: Secondary | ICD-10-CM | POA: Diagnosis not present

## 2015-12-26 DIAGNOSIS — L309 Dermatitis, unspecified: Secondary | ICD-10-CM | POA: Diagnosis not present

## 2015-12-26 DIAGNOSIS — N529 Male erectile dysfunction, unspecified: Secondary | ICD-10-CM | POA: Diagnosis not present

## 2016-01-11 DIAGNOSIS — G4733 Obstructive sleep apnea (adult) (pediatric): Secondary | ICD-10-CM | POA: Diagnosis not present

## 2016-01-19 DIAGNOSIS — G4733 Obstructive sleep apnea (adult) (pediatric): Secondary | ICD-10-CM | POA: Diagnosis not present

## 2016-02-19 DIAGNOSIS — G4733 Obstructive sleep apnea (adult) (pediatric): Secondary | ICD-10-CM | POA: Diagnosis not present

## 2016-03-21 DIAGNOSIS — G4733 Obstructive sleep apnea (adult) (pediatric): Secondary | ICD-10-CM | POA: Diagnosis not present

## 2016-04-20 DIAGNOSIS — G4733 Obstructive sleep apnea (adult) (pediatric): Secondary | ICD-10-CM | POA: Diagnosis not present

## 2016-04-23 DIAGNOSIS — N529 Male erectile dysfunction, unspecified: Secondary | ICD-10-CM | POA: Diagnosis not present

## 2016-04-23 DIAGNOSIS — E559 Vitamin D deficiency, unspecified: Secondary | ICD-10-CM | POA: Diagnosis not present

## 2016-04-23 DIAGNOSIS — R2990 Unspecified symptoms and signs involving the nervous system: Secondary | ICD-10-CM | POA: Diagnosis not present

## 2016-04-23 DIAGNOSIS — E1165 Type 2 diabetes mellitus with hyperglycemia: Secondary | ICD-10-CM | POA: Diagnosis not present

## 2016-04-23 DIAGNOSIS — E782 Mixed hyperlipidemia: Secondary | ICD-10-CM | POA: Diagnosis not present

## 2016-04-23 DIAGNOSIS — G4730 Sleep apnea, unspecified: Secondary | ICD-10-CM | POA: Diagnosis not present

## 2016-04-23 DIAGNOSIS — J309 Allergic rhinitis, unspecified: Secondary | ICD-10-CM | POA: Diagnosis not present

## 2016-04-23 DIAGNOSIS — J45909 Unspecified asthma, uncomplicated: Secondary | ICD-10-CM | POA: Diagnosis not present

## 2016-04-23 DIAGNOSIS — E291 Testicular hypofunction: Secondary | ICD-10-CM | POA: Diagnosis not present

## 2016-04-23 DIAGNOSIS — G4700 Insomnia, unspecified: Secondary | ICD-10-CM | POA: Diagnosis not present

## 2016-04-23 DIAGNOSIS — Z23 Encounter for immunization: Secondary | ICD-10-CM | POA: Diagnosis not present

## 2016-04-23 DIAGNOSIS — M8580 Other specified disorders of bone density and structure, unspecified site: Secondary | ICD-10-CM | POA: Diagnosis not present

## 2016-04-24 DIAGNOSIS — G4733 Obstructive sleep apnea (adult) (pediatric): Secondary | ICD-10-CM | POA: Diagnosis not present

## 2016-04-30 ENCOUNTER — Other Ambulatory Visit: Payer: Self-pay | Admitting: Family Medicine

## 2016-04-30 DIAGNOSIS — R2990 Unspecified symptoms and signs involving the nervous system: Secondary | ICD-10-CM

## 2016-05-03 ENCOUNTER — Ambulatory Visit
Admission: RE | Admit: 2016-05-03 | Discharge: 2016-05-03 | Disposition: A | Discharge status: Home / Self Care | Payer: PPO | Source: Ambulatory Visit | Attending: Family Medicine | Admitting: Family Medicine

## 2016-05-03 DIAGNOSIS — R4781 Slurred speech: Secondary | ICD-10-CM | POA: Diagnosis not present

## 2016-05-03 DIAGNOSIS — R2990 Unspecified symptoms and signs involving the nervous system: Secondary | ICD-10-CM

## 2016-05-21 DIAGNOSIS — G4733 Obstructive sleep apnea (adult) (pediatric): Secondary | ICD-10-CM | POA: Diagnosis not present

## 2016-06-20 DIAGNOSIS — G4733 Obstructive sleep apnea (adult) (pediatric): Secondary | ICD-10-CM | POA: Diagnosis not present

## 2016-07-21 DIAGNOSIS — G4733 Obstructive sleep apnea (adult) (pediatric): Secondary | ICD-10-CM | POA: Diagnosis not present

## 2016-08-09 DIAGNOSIS — G4733 Obstructive sleep apnea (adult) (pediatric): Secondary | ICD-10-CM | POA: Diagnosis not present

## 2016-08-21 DIAGNOSIS — E782 Mixed hyperlipidemia: Secondary | ICD-10-CM | POA: Diagnosis not present

## 2016-08-21 DIAGNOSIS — M8580 Other specified disorders of bone density and structure, unspecified site: Secondary | ICD-10-CM | POA: Diagnosis not present

## 2016-08-21 DIAGNOSIS — N529 Male erectile dysfunction, unspecified: Secondary | ICD-10-CM | POA: Diagnosis not present

## 2016-08-21 DIAGNOSIS — G4700 Insomnia, unspecified: Secondary | ICD-10-CM | POA: Diagnosis not present

## 2016-08-21 DIAGNOSIS — J45909 Unspecified asthma, uncomplicated: Secondary | ICD-10-CM | POA: Diagnosis not present

## 2016-08-21 DIAGNOSIS — E1165 Type 2 diabetes mellitus with hyperglycemia: Secondary | ICD-10-CM | POA: Diagnosis not present

## 2016-08-21 DIAGNOSIS — G4730 Sleep apnea, unspecified: Secondary | ICD-10-CM | POA: Diagnosis not present

## 2016-08-21 DIAGNOSIS — E559 Vitamin D deficiency, unspecified: Secondary | ICD-10-CM | POA: Diagnosis not present

## 2016-08-21 DIAGNOSIS — I10 Essential (primary) hypertension: Secondary | ICD-10-CM | POA: Diagnosis not present

## 2016-08-21 DIAGNOSIS — E291 Testicular hypofunction: Secondary | ICD-10-CM | POA: Diagnosis not present

## 2016-08-21 DIAGNOSIS — Z7984 Long term (current) use of oral hypoglycemic drugs: Secondary | ICD-10-CM | POA: Diagnosis not present

## 2016-09-20 DIAGNOSIS — M7582 Other shoulder lesions, left shoulder: Secondary | ICD-10-CM | POA: Diagnosis not present

## 2016-09-20 DIAGNOSIS — M7581 Other shoulder lesions, right shoulder: Secondary | ICD-10-CM | POA: Diagnosis not present

## 2016-12-10 DIAGNOSIS — L309 Dermatitis, unspecified: Secondary | ICD-10-CM | POA: Diagnosis not present

## 2016-12-10 DIAGNOSIS — Z7984 Long term (current) use of oral hypoglycemic drugs: Secondary | ICD-10-CM | POA: Diagnosis not present

## 2016-12-10 DIAGNOSIS — E1165 Type 2 diabetes mellitus with hyperglycemia: Secondary | ICD-10-CM | POA: Diagnosis not present

## 2016-12-10 DIAGNOSIS — J302 Other seasonal allergic rhinitis: Secondary | ICD-10-CM | POA: Diagnosis not present

## 2016-12-10 DIAGNOSIS — I10 Essential (primary) hypertension: Secondary | ICD-10-CM | POA: Diagnosis not present

## 2016-12-20 DIAGNOSIS — L308 Other specified dermatitis: Secondary | ICD-10-CM | POA: Diagnosis not present

## 2016-12-20 DIAGNOSIS — L821 Other seborrheic keratosis: Secondary | ICD-10-CM | POA: Diagnosis not present

## 2017-01-22 DIAGNOSIS — H11152 Pinguecula, left eye: Secondary | ICD-10-CM | POA: Diagnosis not present

## 2017-01-22 DIAGNOSIS — H2513 Age-related nuclear cataract, bilateral: Secondary | ICD-10-CM | POA: Diagnosis not present

## 2017-01-22 DIAGNOSIS — E119 Type 2 diabetes mellitus without complications: Secondary | ICD-10-CM | POA: Diagnosis not present

## 2017-01-22 DIAGNOSIS — H5203 Hypermetropia, bilateral: Secondary | ICD-10-CM | POA: Diagnosis not present

## 2017-03-14 ENCOUNTER — Telehealth: Payer: Self-pay | Admitting: Pharmacist

## 2017-03-14 ENCOUNTER — Encounter: Payer: Self-pay | Admitting: Pharmacist

## 2017-03-14 NOTE — Patient Outreach (Signed)
[facility]) Care Management  [place]   03/14/2017  Domingo Fuson Piccini 1951/07/01 [id number]  Subjective: Called patient regarding referral from [facility].  HIPAA identifiers were obtained. Patient is a 66 year old male with multiple medical conditions including but not limited to:  Obesity, obstructive sleep apnea, low testosterone, type 2 diabetes, hyperlipidemia and hypertension.  Patient was referred due to Androgel costing him >$675.00.  Patient reported managing his own medications without issue.  Objective:   Encounter Medications: Outpatient Encounter Prescriptions as of 03/14/2017  Medication Sig Note  . albuterol (PROVENTIL HFA;VENTOLIN HFA) 108 (90 BASE) MCG/ACT inhaler Inhale 2 puffs into the lungs every 4 (four) hours as needed for wheezing or shortness of breath.   Marland Kitchen aspirin 81 MG tablet Take 81 mg by mouth daily.   Marland Kitchen atorvastatin (LIPITOR) 20 MG tablet Take 20 mg by mouth daily.    . B Complex-Folic Acid (B COMPLEX-VITAMIN B12 PO) Take 1 tablet by mouth daily.   . Calcium Carbonate 1500 (600 CA) MG TABS Take 1 tablet by mouth daily.    Marland Kitchen losartan (COZAAR) 25 MG tablet Take 25 mg by mouth daily.   . metFORMIN (GLUCOPHAGE) 500 MG tablet Take 500 mg by mouth 2 (two) times daily with a meal.   . Omega-3 Fatty Acids (FISH OIL) 1000 MG CAPS Take 1 capsule by mouth daily.   . sildenafil (VIAGRA) 100 MG tablet Take 100 mg by mouth daily as needed for erectile dysfunction.   . Testosterone (ANDROGEL) 20.25 MG/1.25GM (1.62%) GEL Place 2 Pump onto the skin daily.   . Vitamin D, Ergocalciferol, 2000 units CAPS Take 2 capsules by mouth 2 (two) times daily.   . [DISCONTINUED] TESTIM 50 MG/5GM (1%) GEL Apply 2 application topically daily. 10/07/2014: Received from: External Pharmacy Received Sig:    No facility-administered encounter medications on file as of 03/14/2017.     Functional Status: No flowsheet data  found.  Fall/Depression Screening: No flowsheet data found. No flowsheet data found.    Assessment: Patient's medications and allergies were reviewed via telephone:   Drugs sorted by system:  Neurologic/Psychologic:  Cardiovascular: Aspirin Losartan Atorvastatin Omega 3 Fatty Acids  Pulmonary/Allergy: Ventolin  Endocrine: Metformin  Vitamins/Minerals: Vitamin B 12 complex Vitamin D  Miscellaneous: Androgel  Sildenafil  Medication Assistance Findings: -patient is over income for The "Extra Help/LIS" program provided by Social Security -patient is over the income guidelines for [employer]'s Patient Assistance Program for Angrogel  Patient said the pharmacist at [facility] told him about another product that would only cost him $128 for a one month supply at cash price.  Testosterone 1% (Like Generic Testim) 12.5mg /1.25g--NDC number:  16967-893-81.  [employer] was called and they quoted a price of $140 for 2 bottles. (To get 50mg  of the product, the patient would have to use 4 pumps)  [employer] was called. The representative said the product quoted by the [facility] Pharmacist was non-formulary.  (The patient would have to pay the $140 cash without any coverage from the insurance).  However, [employer] did quote an alternative product:  Danazol 50mg ,. 100mg , and 200mg  tablets.    Danzol is not approved to treat hypogonadism. It is approved to treat fibrocystic breast disease, endometriosis and hereditary angioedema (in my opinion-- not a viable or clinically proven option). The price would be $178.00 for a 90 day supply (patient is in the coverage gap) Next year, Danazol would be a tier 2 medication.  Recommendation: If deemed therapeutically appropriate and since the patient is willing to pay cash, generic (Testosterone 1% gel 12.5mg /1.25mg ) could be substituted for Androgel 1.62%    2 pumps of the Androgel 1.62% = 40.5mg  4 pumps of the Testosterone gel 1% =  50mg  2 pumps of the Testosterone gel 1%= 25mg    Plan: Route note to Provider Patient said he would speak with his provider at his upcoming visit in October Follow up with patient mid October   Elayne Guerin, PharmD, [place] Pharmacist [phone]

## 2017-03-19 NOTE — Progress Notes (Signed)
Cardiology Office Note:    Date:  03/20/2017   ID:  Eric Campbell, DOB 01/27/1951, MRN [id number]  PCP:  Antony Contras, MD  Cardiologist:  Fransico Him, MD   Referring MD: Antony Contras, MD   Chief Complaint  Patient presents with  . Sleep Apnea    History of Present Illness:    Eric Campbell is a 67 y.o. male with a hx of OSA and is on CPAP.  He is doing well with his CPAP device and thinks that he has gotten used to it.  He tolerates the nasal mask and feels the pressure is adequate.  Since going on CPAP he feels rested in the am and has no significant daytime sleepiness although occasionally he will take an afternoon nap if he did not sleep much the night before.  He denies any significant mouth or nasal dryness or nasal congestion.  He does not think that he snores.  He states that he does not exercise enough.    Past Medical History:  Diagnosis Date  . Asthma   . Asthma   . BPH (benign prostatic hyperplasia)   . Congenital hearing loss   . Diabetes mellitus    diet controlled  . Dyslipidemia   . ED (erectile dysfunction)   . Hyperlipidemia   . Hypogonadism male   . Obesity   . Seasonal allergies   . Sleep apnea    cpap  . Sleep apnea   . Torn ligament    left ankle  . Vitamin D deficiency     Past Surgical History:  Procedure Laterality Date  . UMBILICAL HERNIA REPAIR  1998    Current Medications: Current Meds  Medication Sig  . albuterol (PROVENTIL HFA;VENTOLIN HFA) 108 (90 BASE) MCG/ACT inhaler Inhale 2 puffs into the lungs every 4 (four) hours as needed for wheezing or shortness of breath.  Marland Kitchen aspirin 81 MG tablet Take 81 mg by mouth daily.  Marland Kitchen atorvastatin (LIPITOR) 20 MG tablet Take 20 mg by mouth daily.   . B Complex-Folic Acid (B COMPLEX-VITAMIN B12 PO) Take 1 tablet by mouth daily.  . Calcium Carbonate 1500 (600 CA) MG TABS Take 1 tablet by mouth daily.   Marland Kitchen losartan (COZAAR) 25 MG tablet Take 25 mg by mouth daily.  . metFORMIN (GLUCOPHAGE) 500 MG  tablet Take 500 mg by mouth 2 (two) times daily with a meal.  . Omega-3 Fatty Acids (FISH OIL) 1000 MG CAPS Take 1 capsule by mouth daily.  . sildenafil (VIAGRA) 100 MG tablet Take 100 mg by mouth daily as needed for erectile dysfunction.  . Testosterone (ANDROGEL) 20.25 MG/1.25GM (1.62%) GEL Place 2 Pump onto the skin daily.  . Vitamin D, Ergocalciferol, 2000 units CAPS Take 2 capsules by mouth 2 (two) times daily.     Allergies:   Penicillins and Lotrisone [clotrimazole-betamethasone]   Social History   Social History  . Marital status: Married    Spouse name: donna  . Number of children: 3  . Years of education: college   Occupational History  . [employer]    Social History Main Topics  . Smoking status: Never Smoker  . Smokeless tobacco: Never Used  . Alcohol use No  . Drug use: No  . Sexual activity: Not Asked   Other Topics Concern  . None   Social History Narrative   Tobacco use cigarettes: Never smoked. Tobacco history last upated 10/27/2013 no smoking Alcohol : Rare>> No Recreational drug use.  Family History: The patient's family history includes Pancreatic cancer (age of onset: 81) in his mother. There is no history of Colon cancer, Stomach cancer, or Rectal cancer.  ROS:   Please see the history of present illness.    ROS  All other systems reviewed and negative.   EKGs/Labs/Other Studies Reviewed:    The following studies were reviewed today: CPAP download  EKG:  EKG is not ordered today.   Recent Labs: No results found for requested labs within last 8760 hours.   Recent Lipid Panel No results found for: CHOL, TRIG, HDL, CHOLHDL, VLDL, LDLCALC, LDLDIRECT  Physical Exam:    VS:  BP 118/82   Pulse 64   Ht 5' 8.5" (1.74 m)   Wt 269 lb 12.8 oz (122.4 kg)   SpO2 94%   BMI 40.43 kg/m     Wt Readings from Last 3 Encounters:  03/20/17 269 lb 12.8 oz (122.4 kg)  10/17/15 267 lb 1.9 oz (121.2 kg)  06/13/15 268 lb (121.6 kg)      GEN:  Well nourished, well developed in no acute distress HEENT: Normal NECK: No JVD; No carotid bruits LYMPHATICS: No lymphadenopathy CARDIAC: RRR, no murmurs, rubs, gallops RESPIRATORY:  Clear to auscultation without rales, wheezing or rhonchi  ABDOMEN: Soft, non-tender, non-distended MUSCULOSKELETAL:  No edema; No deformity  SKIN: Warm and dry NEUROLOGIC:  Alert and oriented x 3 PSYCHIATRIC:  Normal affect   ASSESSMENT:    1. OSA (obstructive sleep apnea)   2. Obesity (BMI 30-39.9)    PLAN:    In order of problems listed above:  1.  OSA - the patient is tolerating PAP therapy well without any problems. The PAP download was reviewed today and showed an AHI of 3/hr on 18 cm H2O with 100% compliance in using more than 4 hours nightly.  The patient has been using and benefiting from CPAP use and will continue to benefit from therapy.   2. Obesity - I have encouraged him to get into a routine exercise program and cut back on carbs and portions.      Medication Adjustments/Labs and Tests Ordered: Current medicines are reviewed at length with the patient today.  Concerns regarding medicines are outlined above.  No orders of the defined types were placed in this encounter.  No orders of the defined types were placed in this encounter.   Signed, Fransico Him, MD  03/20/2017 9:03 AM    [place]

## 2017-03-20 ENCOUNTER — Encounter: Payer: Self-pay | Admitting: Cardiology

## 2017-03-20 ENCOUNTER — Ambulatory Visit (INDEPENDENT_AMBULATORY_CARE_PROVIDER_SITE_OTHER): Payer: PPO | Admitting: Cardiology

## 2017-03-20 VITALS — BP 118/82 | HR 64 | Ht 68.5 in | Wt 269.8 lb

## 2017-03-20 DIAGNOSIS — E669 Obesity, unspecified: Secondary | ICD-10-CM | POA: Diagnosis not present

## 2017-03-20 DIAGNOSIS — G4733 Obstructive sleep apnea (adult) (pediatric): Secondary | ICD-10-CM

## 2017-03-20 NOTE — Patient Instructions (Signed)
Medication Instructions:  Your physician recommends that you continue on your current medications as directed. Please refer to the Current Medication list given to you today.   Labwork: None ordered   Testing/Procedures: None ordered   Follow-Up: Your physician wants you to follow-up in: 12 months with Dr. Turner.  You will receive a reminder letter in the mail two months in advance. If you don't receive a letter, please call our office to schedule the follow-up appointment.   Any Other Special Instructions Will Be Listed Below (If Applicable).     If you need a refill on your cardiac medications before your next appointment, please call your pharmacy.   

## 2017-03-22 DIAGNOSIS — L821 Other seborrheic keratosis: Secondary | ICD-10-CM | POA: Diagnosis not present

## 2017-03-22 DIAGNOSIS — B078 Other viral warts: Secondary | ICD-10-CM | POA: Diagnosis not present

## 2017-03-22 DIAGNOSIS — L820 Inflamed seborrheic keratosis: Secondary | ICD-10-CM | POA: Diagnosis not present

## 2017-03-22 DIAGNOSIS — D1801 Hemangioma of skin and subcutaneous tissue: Secondary | ICD-10-CM | POA: Diagnosis not present

## 2017-04-01 ENCOUNTER — Other Ambulatory Visit: Payer: Self-pay | Admitting: Pharmacist

## 2017-04-02 NOTE — Patient Outreach (Addendum)
[facility]) Care Management  04/02/2017  Eric Campbell 1951-02-09 [id number]  Patient left a message on my voicemail stating he needed to ask a question. Patient was called back but he did not answer. HIPAA compliant message left on the patient's voicemail.  Plan: Call patient back in 1-2 business days if I do not hear back from him first.   Elayne Guerin, PharmD, [facility] Clinical Pharmacist [phone]  ADDENDUM  I called the patient back.  HIPAA identifiers were obtained. He had a few questions about our previous conversations.  It was confirmed the testosterone product [employer] quoted him a price on last month is not covered by [employer] and would have a $140 copay.   Patient wondered about testosterone injection as he said the [facility] pharmacist told him it was the cheapest option.  Patient was given the goodrx website to research drug prices and to obtain coupons for products. (It was explained the coupons cannot be used with insurance.)  Patient was encouraged to speak with his provider about which product he would like to switch him to.   Patient was also instructed about suing the medicare.gov website to compare coverage for next year.  Plan:  Check on patient at pre-scheduled call after his provider visit 04/16/17  Elayne Guerin, PharmD, [place] Clinical Pharmacist [phone]

## 2017-04-03 ENCOUNTER — Ambulatory Visit: Payer: Self-pay | Admitting: Pharmacist

## 2017-04-16 ENCOUNTER — Other Ambulatory Visit: Payer: Self-pay | Admitting: Pharmacist

## 2017-04-16 NOTE — Patient Outreach (Signed)
[facility]) Care Management  04/16/2017  [place] August 04, 1950 [id number]   Called patient to follow up on the testosterone. HIPAA identifiers were obtained. Patient said he missed his doctor's appointment so he did not get to discuss the testosterone with his provider. Patient reported his appointment has been rescheduled for 04/29/17.  Plan: Check in with patient after his appointment.  Elayne Guerin, PharmD, [place] Clinical Pharmacist [phone]

## 2017-04-19 DIAGNOSIS — L281 Prurigo nodularis: Secondary | ICD-10-CM | POA: Diagnosis not present

## 2017-04-19 DIAGNOSIS — D1801 Hemangioma of skin and subcutaneous tissue: Secondary | ICD-10-CM | POA: Diagnosis not present

## 2017-04-19 DIAGNOSIS — L308 Other specified dermatitis: Secondary | ICD-10-CM | POA: Diagnosis not present

## 2017-04-29 DIAGNOSIS — I10 Essential (primary) hypertension: Secondary | ICD-10-CM | POA: Diagnosis not present

## 2017-04-29 DIAGNOSIS — E291 Testicular hypofunction: Secondary | ICD-10-CM | POA: Diagnosis not present

## 2017-04-29 DIAGNOSIS — E782 Mixed hyperlipidemia: Secondary | ICD-10-CM | POA: Diagnosis not present

## 2017-04-29 DIAGNOSIS — Z23 Encounter for immunization: Secondary | ICD-10-CM | POA: Diagnosis not present

## 2017-04-29 DIAGNOSIS — E1165 Type 2 diabetes mellitus with hyperglycemia: Secondary | ICD-10-CM | POA: Diagnosis not present

## 2017-04-29 DIAGNOSIS — E559 Vitamin D deficiency, unspecified: Secondary | ICD-10-CM | POA: Diagnosis not present

## 2017-04-29 DIAGNOSIS — Z1389 Encounter for screening for other disorder: Secondary | ICD-10-CM | POA: Diagnosis not present

## 2017-04-29 DIAGNOSIS — G4730 Sleep apnea, unspecified: Secondary | ICD-10-CM | POA: Diagnosis not present

## 2017-04-29 DIAGNOSIS — M8580 Other specified disorders of bone density and structure, unspecified site: Secondary | ICD-10-CM | POA: Diagnosis not present

## 2017-04-29 DIAGNOSIS — J45909 Unspecified asthma, uncomplicated: Secondary | ICD-10-CM | POA: Diagnosis not present

## 2017-04-29 DIAGNOSIS — N529 Male erectile dysfunction, unspecified: Secondary | ICD-10-CM | POA: Diagnosis not present

## 2017-05-01 ENCOUNTER — Ambulatory Visit: Payer: Self-pay | Admitting: Pharmacist

## 2017-05-01 ENCOUNTER — Other Ambulatory Visit: Payer: Self-pay | Admitting: Pharmacist

## 2017-05-01 NOTE — Patient Outreach (Signed)
[facility]) Care Management  05/01/2017  [place] Apr 11, 1951 [id number]   Called patient to follow up on the testosterone. Unfortunately, patient did not answer the phone. He was supposed to see his provider on 04/29/17 to discuss options. Patient is in the coverage gap for this year and found a cheaper testosterone product at [facility]. Other therapeutic options were reviewed with the patient last month that he was planning to take to his provider's appointment.  Since this was just a follow up call, patient's case will be closed.  Plan: Close patient case. Send letter to patient and provider  Elayne Guerin, PharmD, [place] Clinical Pharmacist [phone]

## 2017-05-29 DIAGNOSIS — E119 Type 2 diabetes mellitus without complications: Secondary | ICD-10-CM | POA: Diagnosis not present

## 2017-05-29 DIAGNOSIS — N529 Male erectile dysfunction, unspecified: Secondary | ICD-10-CM | POA: Diagnosis not present

## 2017-05-29 DIAGNOSIS — E291 Testicular hypofunction: Secondary | ICD-10-CM | POA: Diagnosis not present

## 2017-05-29 DIAGNOSIS — M8580 Other specified disorders of bone density and structure, unspecified site: Secondary | ICD-10-CM | POA: Diagnosis not present

## 2017-06-18 DIAGNOSIS — G4733 Obstructive sleep apnea (adult) (pediatric): Secondary | ICD-10-CM | POA: Diagnosis not present

## 2017-07-09 DIAGNOSIS — R0602 Shortness of breath: Secondary | ICD-10-CM | POA: Diagnosis not present

## 2017-07-09 DIAGNOSIS — R6889 Other general symptoms and signs: Secondary | ICD-10-CM | POA: Diagnosis not present

## 2017-07-09 DIAGNOSIS — J209 Acute bronchitis, unspecified: Secondary | ICD-10-CM | POA: Diagnosis not present

## 2017-09-23 DIAGNOSIS — M8588 Other specified disorders of bone density and structure, other site: Secondary | ICD-10-CM | POA: Diagnosis not present

## 2017-09-25 DIAGNOSIS — R062 Wheezing: Secondary | ICD-10-CM | POA: Diagnosis not present

## 2017-09-25 DIAGNOSIS — J392 Other diseases of pharynx: Secondary | ICD-10-CM | POA: Diagnosis not present

## 2017-09-25 DIAGNOSIS — Z6841 Body Mass Index (BMI) 40.0 and over, adult: Secondary | ICD-10-CM | POA: Diagnosis not present

## 2017-11-08 DIAGNOSIS — B029 Zoster without complications: Secondary | ICD-10-CM | POA: Diagnosis not present

## 2017-11-12 DIAGNOSIS — Z1211 Encounter for screening for malignant neoplasm of colon: Secondary | ICD-10-CM | POA: Diagnosis not present

## 2017-11-12 DIAGNOSIS — E291 Testicular hypofunction: Secondary | ICD-10-CM | POA: Diagnosis not present

## 2017-11-12 DIAGNOSIS — E1169 Type 2 diabetes mellitus with other specified complication: Secondary | ICD-10-CM | POA: Diagnosis not present

## 2017-11-12 DIAGNOSIS — Z23 Encounter for immunization: Secondary | ICD-10-CM | POA: Diagnosis not present

## 2017-11-12 DIAGNOSIS — Z0000 Encounter for general adult medical examination without abnormal findings: Secondary | ICD-10-CM | POA: Diagnosis not present

## 2017-11-12 DIAGNOSIS — Z125 Encounter for screening for malignant neoplasm of prostate: Secondary | ICD-10-CM | POA: Diagnosis not present

## 2017-11-12 DIAGNOSIS — I10 Essential (primary) hypertension: Secondary | ICD-10-CM | POA: Diagnosis not present

## 2017-11-12 DIAGNOSIS — G4730 Sleep apnea, unspecified: Secondary | ICD-10-CM | POA: Diagnosis not present

## 2017-11-12 DIAGNOSIS — E782 Mixed hyperlipidemia: Secondary | ICD-10-CM | POA: Diagnosis not present

## 2017-11-12 DIAGNOSIS — N529 Male erectile dysfunction, unspecified: Secondary | ICD-10-CM | POA: Diagnosis not present

## 2017-11-12 DIAGNOSIS — E559 Vitamin D deficiency, unspecified: Secondary | ICD-10-CM | POA: Diagnosis not present

## 2018-02-12 DIAGNOSIS — H903 Sensorineural hearing loss, bilateral: Secondary | ICD-10-CM | POA: Diagnosis not present

## 2018-02-24 DIAGNOSIS — H903 Sensorineural hearing loss, bilateral: Secondary | ICD-10-CM | POA: Diagnosis not present

## 2018-02-26 ENCOUNTER — Ambulatory Visit: Payer: PPO | Admitting: Cardiology

## 2018-02-27 DIAGNOSIS — H5203 Hypermetropia, bilateral: Secondary | ICD-10-CM | POA: Diagnosis not present

## 2018-02-27 DIAGNOSIS — H524 Presbyopia: Secondary | ICD-10-CM | POA: Diagnosis not present

## 2018-02-27 DIAGNOSIS — H2513 Age-related nuclear cataract, bilateral: Secondary | ICD-10-CM | POA: Diagnosis not present

## 2018-02-27 DIAGNOSIS — H11152 Pinguecula, left eye: Secondary | ICD-10-CM | POA: Diagnosis not present

## 2018-02-27 DIAGNOSIS — E119 Type 2 diabetes mellitus without complications: Secondary | ICD-10-CM | POA: Diagnosis not present

## 2018-02-27 DIAGNOSIS — Z7984 Long term (current) use of oral hypoglycemic drugs: Secondary | ICD-10-CM | POA: Diagnosis not present

## 2018-02-27 DIAGNOSIS — H52203 Unspecified astigmatism, bilateral: Secondary | ICD-10-CM | POA: Diagnosis not present

## 2018-03-11 DIAGNOSIS — H903 Sensorineural hearing loss, bilateral: Secondary | ICD-10-CM | POA: Diagnosis not present

## 2018-03-12 DIAGNOSIS — Z6839 Body mass index (BMI) 39.0-39.9, adult: Secondary | ICD-10-CM | POA: Diagnosis not present

## 2018-03-12 DIAGNOSIS — J069 Acute upper respiratory infection, unspecified: Secondary | ICD-10-CM | POA: Diagnosis not present

## 2018-04-07 ENCOUNTER — Encounter: Payer: Self-pay | Admitting: Cardiology

## 2018-04-21 ENCOUNTER — Ambulatory Visit (INDEPENDENT_AMBULATORY_CARE_PROVIDER_SITE_OTHER): Payer: PPO | Admitting: Cardiology

## 2018-04-21 ENCOUNTER — Encounter: Payer: Self-pay | Admitting: Cardiology

## 2018-04-21 VITALS — BP 133/81 | HR 76 | Ht 68.5 in | Wt 263.1 lb

## 2018-04-21 DIAGNOSIS — E669 Obesity, unspecified: Secondary | ICD-10-CM

## 2018-04-21 DIAGNOSIS — G4733 Obstructive sleep apnea (adult) (pediatric): Secondary | ICD-10-CM | POA: Diagnosis not present

## 2018-04-21 NOTE — Patient Instructions (Signed)

## 2018-04-21 NOTE — Progress Notes (Signed)
Cardiology Office Note:    Date:  04/21/2018   ID:  Eric Campbell, DOB Nov 17, 1950, MRN [id number]  PCP:  Antony Contras, MD  Cardiologist:  No primary care provider on file.    Referring MD: Antony Contras, MD He Chief Complaint  Patient presents with  . Sleep Apnea  . Hypertension    History of Present Illness:    Eric Campbell is a 67 y.o. male with a hx of OSA and is on CPAP.He is doing well with his CPAP.   He tolerates the mask and feels the pressure is adequate.  Since going on CPAP he feels rested in the am and has no significant daytime sleepiness.  He denies any significant mouth or nasal dryness or nasal congestion.  He does not think that he snores.     Past Medical History:  Diagnosis Date  . Asthma   . Asthma   . BPH (benign prostatic hyperplasia)   . Congenital hearing loss   . Diabetes mellitus    diet controlled  . Dyslipidemia   . ED (erectile dysfunction)   . Hyperlipidemia   . Hypogonadism male   . Obesity   . Seasonal allergies   . Sleep apnea    cpap  . Sleep apnea   . Torn ligament    left ankle  . Vitamin D deficiency     Past Surgical History:  Procedure Laterality Date  . UMBILICAL HERNIA REPAIR  1998    Current Medications: Current Meds  Medication Sig  . albuterol (PROVENTIL HFA;VENTOLIN HFA) 108 (90 BASE) MCG/ACT inhaler Inhale 2 puffs into the lungs every 4 (four) hours as needed for wheezing or shortness of breath.  Marland Kitchen aspirin 81 MG tablet Take 81 mg by mouth daily.  Marland Kitchen atorvastatin (LIPITOR) 20 MG tablet Take 20 mg by mouth daily.   . B Complex-Folic Acid (B COMPLEX-VITAMIN B12 PO) Take 1 tablet by mouth daily.  . Calcium Carbonate 1500 (600 CA) MG TABS Take 1 tablet by mouth daily.   Marland Kitchen losartan (COZAAR) 25 MG tablet Take 25 mg by mouth daily.  . metFORMIN (GLUCOPHAGE) 500 MG tablet Take 500 mg by mouth 2 (two) times daily with a meal.  . Omega-3 Fatty Acids (FISH OIL) 1000 MG CAPS Take 1 capsule by mouth daily.  . sildenafil  (VIAGRA) 100 MG tablet Take 100 mg by mouth daily as needed for erectile dysfunction.  . Vitamin D, Ergocalciferol, 2000 units CAPS Take 2 capsules by mouth 2 (two) times daily.     Allergies:   Penicillins and Lotrisone [clotrimazole-betamethasone]   Social History   Socioeconomic History  . Marital status: Married    Spouse name: donna  . Number of children: 3  . Years of education: college  . Highest education level: Not on file  Occupational History  . 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 Financial resource strain: Not on file  . Food insecurity:    Worry: Not on file    Inability: Not on file  . Transportation needs:    Medical: Not on file    Non-medical: Not on file  Tobacco Use  . Smoking status: Never Smoker  . Smokeless tobacco: Never Used  Substance and Sexual Activity  . Alcohol use: No  . Drug use: No  . Sexual activity: Not on file  Lifestyle  . Physical activity:    Days per week: Not on file    Minutes per session: Not on file  .  Stress: Not on file  Relationships  . Social connections:    Talks on phone: Not on file    Gets together: Not on file    Attends religious service: Not on file    Active member of club or organization: Not on file    Attends meetings of clubs or organizations: Not on file    Relationship status: Not on file  Other Topics Concern  . Not on file  Social History Narrative   Tobacco use cigarettes: Never smoked. Tobacco history last upated 10/27/2013 no smoking Alcohol : Rare>> No Recreational drug use.     Family History: The patient's family history includes Pancreatic cancer (age of onset: 73) in his mother. There is no history of Colon cancer, Stomach cancer, or Rectal cancer.  ROS:   Please see the history of present illness.    ROS  All other systems reviewed and negative.   EKGs/Labs/Other Studies Reviewed:    The following studies were reviewed today: PAP download  EKG:  EKG is not ordered  today.    Recent Labs: No results found for requested labs within last 8760 hours.   Recent Lipid Panel No results found for: CHOL, TRIG, HDL, CHOLHDL, VLDL, LDLCALC, LDLDIRECT  Physical Exam:    VS:  There were no vitals taken for this visit.    Wt Readings from Last 3 Encounters:  03/20/17 269 lb 12.8 oz (122.4 kg)  10/17/15 267 lb 1.9 oz (121.2 kg)  06/13/15 268 lb (121.6 kg)     GEN:  Well nourished, well developed in no acute distress HEENT: Normal NECK: No JVD; No carotid bruits LYMPHATICS: No lymphadenopathy CARDIAC: RRR, no murmurs, rubs, gallops RESPIRATORY:  Clear to auscultation without rales, wheezing or rhonchi  ABDOMEN: Soft, non-tender, non-distended MUSCULOSKELETAL:  No edema; No deformity  SKIN: Warm and dry NEUROLOGIC:  Alert and oriented x 3 PSYCHIATRIC:  Normal affect   ASSESSMENT:    1. OSA (obstructive sleep apnea)   2. Obesity (BMI 30-39.9)    PLAN:    In order of problems listed above:  1.  OSA - the patient is tolerating PAP therapy well without any problems. The PAP download was reviewed today and showed an AHI of 1.8/hr on 18 cm H2O with 93% compliance in using more than 4 hours nightly.  The patient has been using and benefiting from PAP use and will continue to benefit from therapy.   2.  Obesity - I have encouraged him to get into a routine exercise program and cut back on carbs and portions.      Medication Adjustments/Labs and Tests Ordered: Current medicines are reviewed at length with the patient today.  Concerns regarding medicines are outlined above.  No orders of the defined types were placed in this encounter.  No orders of the defined types were placed in this encounter.   Signed, Fransico Him, MD  04/21/2018 3:30 PM    [place]

## 2018-05-15 DIAGNOSIS — Z23 Encounter for immunization: Secondary | ICD-10-CM | POA: Diagnosis not present

## 2018-05-15 DIAGNOSIS — E291 Testicular hypofunction: Secondary | ICD-10-CM | POA: Diagnosis not present

## 2018-05-15 DIAGNOSIS — E782 Mixed hyperlipidemia: Secondary | ICD-10-CM | POA: Diagnosis not present

## 2018-05-15 DIAGNOSIS — J309 Allergic rhinitis, unspecified: Secondary | ICD-10-CM | POA: Diagnosis not present

## 2018-05-15 DIAGNOSIS — J45909 Unspecified asthma, uncomplicated: Secondary | ICD-10-CM | POA: Diagnosis not present

## 2018-05-15 DIAGNOSIS — E559 Vitamin D deficiency, unspecified: Secondary | ICD-10-CM | POA: Diagnosis not present

## 2018-05-15 DIAGNOSIS — E1169 Type 2 diabetes mellitus with other specified complication: Secondary | ICD-10-CM | POA: Diagnosis not present

## 2018-05-15 DIAGNOSIS — M8580 Other specified disorders of bone density and structure, unspecified site: Secondary | ICD-10-CM | POA: Diagnosis not present

## 2018-05-15 DIAGNOSIS — N529 Male erectile dysfunction, unspecified: Secondary | ICD-10-CM | POA: Diagnosis not present

## 2018-05-15 DIAGNOSIS — G4730 Sleep apnea, unspecified: Secondary | ICD-10-CM | POA: Diagnosis not present

## 2018-05-15 DIAGNOSIS — I10 Essential (primary) hypertension: Secondary | ICD-10-CM | POA: Diagnosis not present

## 2018-07-21 ENCOUNTER — Telehealth: Payer: Self-pay | Admitting: Cardiology

## 2018-07-21 DIAGNOSIS — R079 Chest pain, unspecified: Secondary | ICD-10-CM | POA: Diagnosis not present

## 2018-07-21 DIAGNOSIS — I10 Essential (primary) hypertension: Secondary | ICD-10-CM | POA: Diagnosis not present

## 2018-07-21 DIAGNOSIS — E1169 Type 2 diabetes mellitus with other specified complication: Secondary | ICD-10-CM | POA: Diagnosis not present

## 2018-07-21 DIAGNOSIS — E782 Mixed hyperlipidemia: Secondary | ICD-10-CM | POA: Diagnosis not present

## 2018-07-21 NOTE — Telephone Encounter (Signed)
Spoke with patient he stated that 2 months ago he was trying to start a gas blower and hurt his chest. He noticed that once that got better, he felt a similar pain a few weeks ago, a dime sized spot, to the left of the sternum. He stated the pain is less and a 1/10 pain, but occurs frequently throughout the day. The pain only occurs about a second at a time. When he presses in the site that hurts he is unable to replicate the pain. He stated the pain does not worsen during ambulation.  Since this morning 1/20 at 8:30-9:30 he has felt the pain 5 times. On Friday, he felt the pain while driving and it was more pain than normal. He stated as well, he has a tingling sensation that occurs across his upper chest as well. He denies SOB, but has felt some chest tightness during movement.  Spoke with Dr. Radford Pax, she advised the patient, that he should go to the hospital to be evaluated. Spoke with the patient, he expressed understanding and had no further questions.

## 2018-07-21 NOTE — Telephone Encounter (Signed)
New message     Pt c/o of Chest Pain: STAT if CP now or developed within 24 hours  1. Are you having CP right now? No  , this morning   2. Are you experiencing any other symptoms (ex. SOB, nausea, vomiting, sweating)? None   3. How long have you been experiencing CP? 2 months ago   4. Is your CP continuous or coming and going? continuous   5. Have you taken Nitroglycerin? No  ?    .Was using gas blower when he felt pain 2 months ago.

## 2018-07-24 DIAGNOSIS — I10 Essential (primary) hypertension: Secondary | ICD-10-CM | POA: Diagnosis not present

## 2018-07-24 DIAGNOSIS — R0789 Other chest pain: Secondary | ICD-10-CM | POA: Diagnosis not present

## 2018-07-24 DIAGNOSIS — E119 Type 2 diabetes mellitus without complications: Secondary | ICD-10-CM | POA: Diagnosis not present

## 2018-07-24 DIAGNOSIS — G4733 Obstructive sleep apnea (adult) (pediatric): Secondary | ICD-10-CM | POA: Diagnosis not present

## 2018-07-30 DIAGNOSIS — I10 Essential (primary) hypertension: Secondary | ICD-10-CM | POA: Diagnosis not present

## 2018-07-30 DIAGNOSIS — R0789 Other chest pain: Secondary | ICD-10-CM | POA: Diagnosis not present

## 2018-07-31 DIAGNOSIS — I10 Essential (primary) hypertension: Secondary | ICD-10-CM | POA: Diagnosis not present

## 2018-07-31 DIAGNOSIS — R079 Chest pain, unspecified: Secondary | ICD-10-CM | POA: Diagnosis not present

## 2018-08-18 ENCOUNTER — Ambulatory Visit: Payer: Self-pay | Admitting: Cardiology

## 2018-09-04 DIAGNOSIS — E291 Testicular hypofunction: Secondary | ICD-10-CM | POA: Diagnosis not present

## 2018-09-08 DIAGNOSIS — J45909 Unspecified asthma, uncomplicated: Secondary | ICD-10-CM | POA: Diagnosis not present

## 2018-09-08 DIAGNOSIS — N400 Enlarged prostate without lower urinary tract symptoms: Secondary | ICD-10-CM | POA: Diagnosis not present

## 2018-09-08 DIAGNOSIS — E1169 Type 2 diabetes mellitus with other specified complication: Secondary | ICD-10-CM | POA: Diagnosis not present

## 2018-09-08 DIAGNOSIS — E782 Mixed hyperlipidemia: Secondary | ICD-10-CM | POA: Diagnosis not present

## 2018-09-08 DIAGNOSIS — I10 Essential (primary) hypertension: Secondary | ICD-10-CM | POA: Diagnosis not present

## 2018-09-08 DIAGNOSIS — E1165 Type 2 diabetes mellitus with hyperglycemia: Secondary | ICD-10-CM | POA: Diagnosis not present

## 2018-10-16 DIAGNOSIS — J45909 Unspecified asthma, uncomplicated: Secondary | ICD-10-CM | POA: Diagnosis not present

## 2018-10-16 DIAGNOSIS — N400 Enlarged prostate without lower urinary tract symptoms: Secondary | ICD-10-CM | POA: Diagnosis not present

## 2018-10-16 DIAGNOSIS — E1165 Type 2 diabetes mellitus with hyperglycemia: Secondary | ICD-10-CM | POA: Diagnosis not present

## 2018-10-16 DIAGNOSIS — E1169 Type 2 diabetes mellitus with other specified complication: Secondary | ICD-10-CM | POA: Diagnosis not present

## 2018-10-16 DIAGNOSIS — E782 Mixed hyperlipidemia: Secondary | ICD-10-CM | POA: Diagnosis not present

## 2018-10-16 DIAGNOSIS — I10 Essential (primary) hypertension: Secondary | ICD-10-CM | POA: Diagnosis not present

## 2018-11-13 DIAGNOSIS — J309 Allergic rhinitis, unspecified: Secondary | ICD-10-CM | POA: Diagnosis not present

## 2018-11-13 DIAGNOSIS — I10 Essential (primary) hypertension: Secondary | ICD-10-CM | POA: Diagnosis not present

## 2018-11-13 DIAGNOSIS — E1165 Type 2 diabetes mellitus with hyperglycemia: Secondary | ICD-10-CM | POA: Diagnosis not present

## 2018-11-13 DIAGNOSIS — J45909 Unspecified asthma, uncomplicated: Secondary | ICD-10-CM | POA: Diagnosis not present

## 2018-11-13 DIAGNOSIS — E291 Testicular hypofunction: Secondary | ICD-10-CM | POA: Diagnosis not present

## 2018-11-13 DIAGNOSIS — E559 Vitamin D deficiency, unspecified: Secondary | ICD-10-CM | POA: Diagnosis not present

## 2018-11-13 DIAGNOSIS — N400 Enlarged prostate without lower urinary tract symptoms: Secondary | ICD-10-CM | POA: Diagnosis not present

## 2018-11-13 DIAGNOSIS — E1169 Type 2 diabetes mellitus with other specified complication: Secondary | ICD-10-CM | POA: Diagnosis not present

## 2018-11-13 DIAGNOSIS — N529 Male erectile dysfunction, unspecified: Secondary | ICD-10-CM | POA: Diagnosis not present

## 2018-11-13 DIAGNOSIS — G4730 Sleep apnea, unspecified: Secondary | ICD-10-CM | POA: Diagnosis not present

## 2018-11-13 DIAGNOSIS — E782 Mixed hyperlipidemia: Secondary | ICD-10-CM | POA: Diagnosis not present

## 2018-11-13 DIAGNOSIS — M8580 Other specified disorders of bone density and structure, unspecified site: Secondary | ICD-10-CM | POA: Diagnosis not present

## 2018-11-27 DIAGNOSIS — E782 Mixed hyperlipidemia: Secondary | ICD-10-CM | POA: Diagnosis not present

## 2018-11-27 DIAGNOSIS — E559 Vitamin D deficiency, unspecified: Secondary | ICD-10-CM | POA: Diagnosis not present

## 2018-11-27 DIAGNOSIS — E1169 Type 2 diabetes mellitus with other specified complication: Secondary | ICD-10-CM | POA: Diagnosis not present

## 2018-12-12 DIAGNOSIS — D225 Melanocytic nevi of trunk: Secondary | ICD-10-CM | POA: Diagnosis not present

## 2018-12-16 DIAGNOSIS — G4733 Obstructive sleep apnea (adult) (pediatric): Secondary | ICD-10-CM | POA: Diagnosis not present

## 2018-12-30 DIAGNOSIS — I10 Essential (primary) hypertension: Secondary | ICD-10-CM | POA: Diagnosis not present

## 2018-12-30 DIAGNOSIS — J45909 Unspecified asthma, uncomplicated: Secondary | ICD-10-CM | POA: Diagnosis not present

## 2018-12-30 DIAGNOSIS — E1169 Type 2 diabetes mellitus with other specified complication: Secondary | ICD-10-CM | POA: Diagnosis not present

## 2018-12-30 DIAGNOSIS — E1165 Type 2 diabetes mellitus with hyperglycemia: Secondary | ICD-10-CM | POA: Diagnosis not present

## 2018-12-30 DIAGNOSIS — E782 Mixed hyperlipidemia: Secondary | ICD-10-CM | POA: Diagnosis not present

## 2018-12-30 DIAGNOSIS — N400 Enlarged prostate without lower urinary tract symptoms: Secondary | ICD-10-CM | POA: Diagnosis not present

## 2019-01-30 DIAGNOSIS — J45909 Unspecified asthma, uncomplicated: Secondary | ICD-10-CM | POA: Diagnosis not present

## 2019-01-30 DIAGNOSIS — E1169 Type 2 diabetes mellitus with other specified complication: Secondary | ICD-10-CM | POA: Diagnosis not present

## 2019-01-30 DIAGNOSIS — E782 Mixed hyperlipidemia: Secondary | ICD-10-CM | POA: Diagnosis not present

## 2019-01-30 DIAGNOSIS — N400 Enlarged prostate without lower urinary tract symptoms: Secondary | ICD-10-CM | POA: Diagnosis not present

## 2019-01-30 DIAGNOSIS — I10 Essential (primary) hypertension: Secondary | ICD-10-CM | POA: Diagnosis not present

## 2019-01-30 DIAGNOSIS — E1165 Type 2 diabetes mellitus with hyperglycemia: Secondary | ICD-10-CM | POA: Diagnosis not present

## 2019-02-20 DIAGNOSIS — E1165 Type 2 diabetes mellitus with hyperglycemia: Secondary | ICD-10-CM | POA: Diagnosis not present

## 2019-02-20 DIAGNOSIS — N400 Enlarged prostate without lower urinary tract symptoms: Secondary | ICD-10-CM | POA: Diagnosis not present

## 2019-02-20 DIAGNOSIS — J45909 Unspecified asthma, uncomplicated: Secondary | ICD-10-CM | POA: Diagnosis not present

## 2019-02-20 DIAGNOSIS — E782 Mixed hyperlipidemia: Secondary | ICD-10-CM | POA: Diagnosis not present

## 2019-02-20 DIAGNOSIS — E1169 Type 2 diabetes mellitus with other specified complication: Secondary | ICD-10-CM | POA: Diagnosis not present

## 2019-02-20 DIAGNOSIS — I10 Essential (primary) hypertension: Secondary | ICD-10-CM | POA: Diagnosis not present

## 2019-03-11 DIAGNOSIS — H52203 Unspecified astigmatism, bilateral: Secondary | ICD-10-CM | POA: Diagnosis not present

## 2019-03-11 DIAGNOSIS — H524 Presbyopia: Secondary | ICD-10-CM | POA: Diagnosis not present

## 2019-03-11 DIAGNOSIS — E119 Type 2 diabetes mellitus without complications: Secondary | ICD-10-CM | POA: Diagnosis not present

## 2019-03-11 DIAGNOSIS — H11152 Pinguecula, left eye: Secondary | ICD-10-CM | POA: Diagnosis not present

## 2019-03-11 DIAGNOSIS — H2513 Age-related nuclear cataract, bilateral: Secondary | ICD-10-CM | POA: Diagnosis not present

## 2019-03-11 DIAGNOSIS — Z7984 Long term (current) use of oral hypoglycemic drugs: Secondary | ICD-10-CM | POA: Diagnosis not present

## 2019-03-11 DIAGNOSIS — H5203 Hypermetropia, bilateral: Secondary | ICD-10-CM | POA: Diagnosis not present

## 2019-03-31 DIAGNOSIS — E782 Mixed hyperlipidemia: Secondary | ICD-10-CM | POA: Diagnosis not present

## 2019-03-31 DIAGNOSIS — N400 Enlarged prostate without lower urinary tract symptoms: Secondary | ICD-10-CM | POA: Diagnosis not present

## 2019-03-31 DIAGNOSIS — E1165 Type 2 diabetes mellitus with hyperglycemia: Secondary | ICD-10-CM | POA: Diagnosis not present

## 2019-03-31 DIAGNOSIS — I10 Essential (primary) hypertension: Secondary | ICD-10-CM | POA: Diagnosis not present

## 2019-03-31 DIAGNOSIS — J45909 Unspecified asthma, uncomplicated: Secondary | ICD-10-CM | POA: Diagnosis not present

## 2019-03-31 DIAGNOSIS — E1169 Type 2 diabetes mellitus with other specified complication: Secondary | ICD-10-CM | POA: Diagnosis not present

## 2019-06-30 DIAGNOSIS — L905 Scar conditions and fibrosis of skin: Secondary | ICD-10-CM | POA: Diagnosis not present

## 2019-07-17 DIAGNOSIS — I10 Essential (primary) hypertension: Secondary | ICD-10-CM | POA: Diagnosis not present

## 2019-07-17 DIAGNOSIS — E1165 Type 2 diabetes mellitus with hyperglycemia: Secondary | ICD-10-CM | POA: Diagnosis not present

## 2019-07-17 DIAGNOSIS — J45909 Unspecified asthma, uncomplicated: Secondary | ICD-10-CM | POA: Diagnosis not present

## 2019-07-17 DIAGNOSIS — E1169 Type 2 diabetes mellitus with other specified complication: Secondary | ICD-10-CM | POA: Diagnosis not present

## 2019-07-17 DIAGNOSIS — N400 Enlarged prostate without lower urinary tract symptoms: Secondary | ICD-10-CM | POA: Diagnosis not present

## 2019-07-17 DIAGNOSIS — E782 Mixed hyperlipidemia: Secondary | ICD-10-CM | POA: Diagnosis not present

## 2019-07-21 DIAGNOSIS — M7581 Other shoulder lesions, right shoulder: Secondary | ICD-10-CM | POA: Diagnosis not present

## 2019-07-21 DIAGNOSIS — M7582 Other shoulder lesions, left shoulder: Secondary | ICD-10-CM | POA: Diagnosis not present

## 2019-07-28 DIAGNOSIS — L905 Scar conditions and fibrosis of skin: Secondary | ICD-10-CM | POA: Diagnosis not present

## 2019-07-28 DIAGNOSIS — L308 Other specified dermatitis: Secondary | ICD-10-CM | POA: Diagnosis not present

## 2019-08-11 ENCOUNTER — Ambulatory Visit: Payer: PPO | Attending: Internal Medicine

## 2019-08-13 ENCOUNTER — Ambulatory Visit: Payer: PPO

## 2019-08-26 DIAGNOSIS — L820 Inflamed seborrheic keratosis: Secondary | ICD-10-CM | POA: Diagnosis not present

## 2019-08-26 DIAGNOSIS — L905 Scar conditions and fibrosis of skin: Secondary | ICD-10-CM | POA: Diagnosis not present

## 2019-10-15 DIAGNOSIS — E1169 Type 2 diabetes mellitus with other specified complication: Secondary | ICD-10-CM | POA: Diagnosis not present

## 2019-10-15 DIAGNOSIS — G4700 Insomnia, unspecified: Secondary | ICD-10-CM | POA: Diagnosis not present

## 2019-10-15 DIAGNOSIS — E1165 Type 2 diabetes mellitus with hyperglycemia: Secondary | ICD-10-CM | POA: Diagnosis not present

## 2019-10-15 DIAGNOSIS — J45909 Unspecified asthma, uncomplicated: Secondary | ICD-10-CM | POA: Diagnosis not present

## 2019-10-15 DIAGNOSIS — E782 Mixed hyperlipidemia: Secondary | ICD-10-CM | POA: Diagnosis not present

## 2019-10-15 DIAGNOSIS — I10 Essential (primary) hypertension: Secondary | ICD-10-CM | POA: Diagnosis not present

## 2019-10-15 DIAGNOSIS — N400 Enlarged prostate without lower urinary tract symptoms: Secondary | ICD-10-CM | POA: Diagnosis not present

## 2019-11-05 DIAGNOSIS — E782 Mixed hyperlipidemia: Secondary | ICD-10-CM | POA: Diagnosis not present

## 2019-11-05 DIAGNOSIS — I10 Essential (primary) hypertension: Secondary | ICD-10-CM | POA: Diagnosis not present

## 2019-11-05 DIAGNOSIS — Z0000 Encounter for general adult medical examination without abnormal findings: Secondary | ICD-10-CM | POA: Diagnosis not present

## 2019-11-05 DIAGNOSIS — E1169 Type 2 diabetes mellitus with other specified complication: Secondary | ICD-10-CM | POA: Diagnosis not present

## 2019-11-05 DIAGNOSIS — M8580 Other specified disorders of bone density and structure, unspecified site: Secondary | ICD-10-CM | POA: Diagnosis not present

## 2019-11-05 DIAGNOSIS — E291 Testicular hypofunction: Secondary | ICD-10-CM | POA: Diagnosis not present

## 2019-11-05 DIAGNOSIS — Z1389 Encounter for screening for other disorder: Secondary | ICD-10-CM | POA: Diagnosis not present

## 2019-11-05 DIAGNOSIS — N529 Male erectile dysfunction, unspecified: Secondary | ICD-10-CM | POA: Diagnosis not present

## 2019-11-05 DIAGNOSIS — E559 Vitamin D deficiency, unspecified: Secondary | ICD-10-CM | POA: Diagnosis not present

## 2019-11-05 DIAGNOSIS — G4730 Sleep apnea, unspecified: Secondary | ICD-10-CM | POA: Diagnosis not present

## 2019-11-05 DIAGNOSIS — G5622 Lesion of ulnar nerve, left upper limb: Secondary | ICD-10-CM | POA: Diagnosis not present

## 2019-11-05 DIAGNOSIS — J45909 Unspecified asthma, uncomplicated: Secondary | ICD-10-CM | POA: Diagnosis not present

## 2019-11-06 DIAGNOSIS — Z1211 Encounter for screening for malignant neoplasm of colon: Secondary | ICD-10-CM | POA: Diagnosis not present

## 2019-11-09 ENCOUNTER — Encounter: Payer: Self-pay | Admitting: Internal Medicine

## 2019-11-10 ENCOUNTER — Ambulatory Visit: Payer: PPO | Admitting: Internal Medicine

## 2019-11-10 ENCOUNTER — Other Ambulatory Visit: Payer: Self-pay

## 2019-11-10 ENCOUNTER — Encounter: Payer: Self-pay | Admitting: Internal Medicine

## 2019-11-10 VITALS — BP 140/80 | HR 77 | Temp 97.6°F | Ht 68.0 in | Wt 264.0 lb

## 2019-11-10 DIAGNOSIS — Z9989 Dependence on other enabling machines and devices: Secondary | ICD-10-CM | POA: Diagnosis not present

## 2019-11-10 DIAGNOSIS — J310 Chronic rhinitis: Secondary | ICD-10-CM | POA: Diagnosis not present

## 2019-11-10 DIAGNOSIS — G4733 Obstructive sleep apnea (adult) (pediatric): Secondary | ICD-10-CM

## 2019-11-10 DIAGNOSIS — J4520 Mild intermittent asthma, uncomplicated: Secondary | ICD-10-CM | POA: Diagnosis not present

## 2019-11-10 NOTE — Progress Notes (Addendum)
Eric Campbell    [id number]    10/29/1950  Primary Care Physician:Swayne, Shanon Brow, MD  Referring Physician: Antony Contras, MD [place],  [place] [address] Reason for Consultation: asthma, wheezing Date of Consultation: 11/10/2019  Chief complaint:   Chief Complaint  Patient presents with  . Consult    Astha, OSA, wears a CPAP.  Wheezes.  Symbicort does not work.  Only sleeps 5 hours a night.     HPI: Eric Campbell is a 69 y.o. man who presents for new patient evaluation of asthma.   Was diagnosed with wheezing and asthma in his 36s.  Childhood second hand smoke exposure  Had gasping for air when he was more active and running. He has taken prednisone in the past for breathing with an ED visit and this helps a lot. None in the last year   Has used CPAP for 25 years. Last study 7-8 years ago. He only sleep 5 hours a night. Hasn't been seen in 11 years for sleep apnea.   Current Regimen:  albuterol prn - last use over 6 months ago. symbicort does not work.  Asthma Triggers: Exacerbations in the last year: none History of hospitalization or intubation: never GERD: denies Allergic Rhinitis:  Yes on cetirizine ACT: No flowsheet data found. FeNO: will obtain   Social History   Occupational History  . Occupation: [employer]  Tobacco Use  . Smoking status: Never Smoker  . Smokeless tobacco: Never Used  Substance and Sexual Activity  . Alcohol use: No  . Drug use: No  . Sexual activity: Not on file    Relevant family history:  Family History  Problem Relation Age of Onset  . Pancreatic cancer Mother 88  . COPD Mother   . Emphysema Mother   . COPD Father   . Heart attack Father   . Emphysema Father   . Colon cancer Neg Hx   . Stomach cancer Neg Hx   . Rectal cancer Neg Hx     Past Medical History:  Diagnosis Date  . Asthma   . Asthma   . BPH (benign prostatic hyperplasia)   . Congenital hearing loss   .  Diabetes mellitus    diet controlled  . Dyslipidemia   . ED (erectile dysfunction)   . Hyperlipidemia   . Hypogonadism male   . Obesity   . Seasonal allergies   . Sleep apnea    cpap  . Sleep apnea   . Torn ligament    left ankle  . Vitamin D deficiency     Past Surgical History:  Procedure Laterality Date  . UMBILICAL HERNIA REPAIR  1998    Physical Exam: Blood pressure 140/80, pulse 77, temperature 97.6 F (36.4 C), temperature source Temporal, height 5\' 8"  (1.727 m), weight 264 lb (119.7 kg), SpO2 95 %. Gen:      No acute distress ENT:  no nasal polyps, mucus membranes moist. Erythema in the nose and oropharynx mallampati IV. Lungs:    No increased respiratory effort, symmetric chest wall excursion, clear to auscultation bilaterally, no wheezes or crackles. There is audible wheezing over his neck but not his lungs CV:         Regular rate and rhythm; no murmurs, rubs, or gallops.  No pedal edema Abd:      + bowel sounds; soft, non-tender; no distension MSK: no acute synovitis of DIP or PIP joints,  no mechanics hands.  Skin:      Warm and dry; no rashes Neuro: normal speech, no focal facial asymmetry, HOH Psych: alert and oriented x3, normal mood and affect   Data Reviewed/Medical Decision Making:  Independent interpretation of tests: Imaging: No chest imaging on file  CPAP download requested - still waiting for reviewing this.  PFTs: None on file  Labs: Labs reviewed from [facility] May 2021 Glucose 134, Cr normal range, electrolytes normal. WBC 8 Hgb 17.5, A1C 6.7%  Immunization status:  Immunization History  Administered Date(s) Administered  . Influenza,inj,Quad PF,6+ Mos 07/02/2019  . PFIZER SARS-COV-2 Vaccination 08/06/2019, 08/27/2019    . I reviewed prior external note(s) from Dr. Moreen Fowler . I reviewed the result(s) of the labs and imaging as noted above.  . I have ordered PFTs  Assessment:  Wheezing - he has upper airway wheezing  Mild  intermittent asthma OSA on CPAP Chronic Allergic Rhinitis  Plan/Recommendations: Continue cetirizine for rhinitis. I offered him singulair and scheduled flonase and he'd like to wait and see if his symptoms worsen before trying these.  His biggest concern today is what to do about his upper airway wheezing which seems to bother his wife. But doesn't bother him because of his hearing deficit. I recommend adherence to CPAP, weight loss, and management of his rhinitis as a start. Suspect this is all upper airway wheezing due to body habitus and not asthma.   Since he is only taking albuterol once every 6 months, I don't see a need to escalate his asthma therapy.   CPAP download reviewed personally from [facility] - excellent adherence 100% with suppression of AHI.   For thoroughness for his asthma I'd like to get a Spirometry and FeNO and have him follow up for OSA and asthma in 3 months with APP. I am happy to see him again after that for his OSA and wheezing.   We discussed disease management and progression at length today.   I spent 60 minutes in the care of this patient today including pre-charting, chart review, review of results, face-to-face care, coordination of care and communication with consultants etc.).   Return to Care: Return in about 3 months (around 02/10/2020) for wheezing, sleep apnea.  Lenice Llamas, MD [facility] [place]  CC: Antony Contras, MD

## 2019-11-10 NOTE — Patient Instructions (Addendum)
The patient should have follow up scheduled in 3 months with APP  Prior to next visit patient should have: Spirometry/Feno - breathing tests for asthma.  For your rhinitis and chronic, you can take flonase and we have other medications if it worsens - just let us know  Flonase - 1 spray on each side of your nose twice a day for first week, then 1 spray on each side.   Instructions for use:  If you also use a saline nasal spray or rinse, use that first.  Position the head with the chin slightly tucked. Use the right hand to spray into the left nostril and the right hand to spray into the left nostril.   Point the bottle away from the septum of your nose (cartilage that divides the two sides of your nose).   Hold the nostril closed on the opposite side from where you will spray  Spray once and gently sniff to pull the medicine into the higher parts of your nose.  Don't sniff too hard as the medicine will drain down the back of your throat instead.  Repeat with a second spray on the same side if prescribed.  Repeat on the other side of your nose.

## 2020-01-14 DIAGNOSIS — E1165 Type 2 diabetes mellitus with hyperglycemia: Secondary | ICD-10-CM | POA: Diagnosis not present

## 2020-01-14 DIAGNOSIS — J45909 Unspecified asthma, uncomplicated: Secondary | ICD-10-CM | POA: Diagnosis not present

## 2020-01-14 DIAGNOSIS — N400 Enlarged prostate without lower urinary tract symptoms: Secondary | ICD-10-CM | POA: Diagnosis not present

## 2020-01-14 DIAGNOSIS — E782 Mixed hyperlipidemia: Secondary | ICD-10-CM | POA: Diagnosis not present

## 2020-01-14 DIAGNOSIS — E1169 Type 2 diabetes mellitus with other specified complication: Secondary | ICD-10-CM | POA: Diagnosis not present

## 2020-01-14 DIAGNOSIS — G4700 Insomnia, unspecified: Secondary | ICD-10-CM | POA: Diagnosis not present

## 2020-01-14 DIAGNOSIS — I10 Essential (primary) hypertension: Secondary | ICD-10-CM | POA: Diagnosis not present

## 2020-03-16 DIAGNOSIS — H5202 Hypermetropia, left eye: Secondary | ICD-10-CM | POA: Diagnosis not present

## 2020-03-16 DIAGNOSIS — H11152 Pinguecula, left eye: Secondary | ICD-10-CM | POA: Diagnosis not present

## 2020-03-16 DIAGNOSIS — E119 Type 2 diabetes mellitus without complications: Secondary | ICD-10-CM | POA: Diagnosis not present

## 2020-03-16 DIAGNOSIS — H2513 Age-related nuclear cataract, bilateral: Secondary | ICD-10-CM | POA: Diagnosis not present

## 2020-03-16 DIAGNOSIS — Z7984 Long term (current) use of oral hypoglycemic drugs: Secondary | ICD-10-CM | POA: Diagnosis not present

## 2020-03-16 DIAGNOSIS — H52203 Unspecified astigmatism, bilateral: Secondary | ICD-10-CM | POA: Diagnosis not present

## 2020-03-16 DIAGNOSIS — H524 Presbyopia: Secondary | ICD-10-CM | POA: Diagnosis not present

## 2020-04-12 DIAGNOSIS — I10 Essential (primary) hypertension: Secondary | ICD-10-CM | POA: Diagnosis not present

## 2020-04-12 DIAGNOSIS — E782 Mixed hyperlipidemia: Secondary | ICD-10-CM | POA: Diagnosis not present

## 2020-04-12 DIAGNOSIS — E1169 Type 2 diabetes mellitus with other specified complication: Secondary | ICD-10-CM | POA: Diagnosis not present

## 2020-04-12 DIAGNOSIS — G4700 Insomnia, unspecified: Secondary | ICD-10-CM | POA: Diagnosis not present

## 2020-04-12 DIAGNOSIS — E1165 Type 2 diabetes mellitus with hyperglycemia: Secondary | ICD-10-CM | POA: Diagnosis not present

## 2020-04-12 DIAGNOSIS — J45909 Unspecified asthma, uncomplicated: Secondary | ICD-10-CM | POA: Diagnosis not present

## 2020-04-12 DIAGNOSIS — N400 Enlarged prostate without lower urinary tract symptoms: Secondary | ICD-10-CM | POA: Diagnosis not present

## 2020-05-10 ENCOUNTER — Encounter: Payer: Self-pay | Admitting: Nurse Practitioner

## 2020-05-10 ENCOUNTER — Ambulatory Visit: Payer: PPO | Admitting: Nurse Practitioner

## 2020-05-10 VITALS — BP 116/60 | HR 92 | Ht 68.0 in | Wt 261.4 lb

## 2020-05-10 DIAGNOSIS — K648 Other hemorrhoids: Secondary | ICD-10-CM

## 2020-05-10 DIAGNOSIS — K625 Hemorrhage of anus and rectum: Secondary | ICD-10-CM | POA: Diagnosis not present

## 2020-05-10 DIAGNOSIS — K5900 Constipation, unspecified: Secondary | ICD-10-CM

## 2020-05-10 DIAGNOSIS — E291 Testicular hypofunction: Secondary | ICD-10-CM | POA: Diagnosis not present

## 2020-05-10 DIAGNOSIS — E1169 Type 2 diabetes mellitus with other specified complication: Secondary | ICD-10-CM | POA: Diagnosis not present

## 2020-05-10 DIAGNOSIS — G4730 Sleep apnea, unspecified: Secondary | ICD-10-CM

## 2020-05-10 DIAGNOSIS — G5622 Lesion of ulnar nerve, left upper limb: Secondary | ICD-10-CM | POA: Diagnosis not present

## 2020-05-10 DIAGNOSIS — R062 Wheezing: Secondary | ICD-10-CM

## 2020-05-10 DIAGNOSIS — J309 Allergic rhinitis, unspecified: Secondary | ICD-10-CM | POA: Diagnosis not present

## 2020-05-10 DIAGNOSIS — I10 Essential (primary) hypertension: Secondary | ICD-10-CM | POA: Diagnosis not present

## 2020-05-10 DIAGNOSIS — E782 Mixed hyperlipidemia: Secondary | ICD-10-CM | POA: Diagnosis not present

## 2020-05-10 DIAGNOSIS — E559 Vitamin D deficiency, unspecified: Secondary | ICD-10-CM | POA: Diagnosis not present

## 2020-05-10 DIAGNOSIS — M85852 Other specified disorders of bone density and structure, left thigh: Secondary | ICD-10-CM | POA: Diagnosis not present

## 2020-05-10 DIAGNOSIS — N529 Male erectile dysfunction, unspecified: Secondary | ICD-10-CM | POA: Diagnosis not present

## 2020-05-10 DIAGNOSIS — J45909 Unspecified asthma, uncomplicated: Secondary | ICD-10-CM

## 2020-05-10 NOTE — Progress Notes (Addendum)
05/11/2020 [place] [id number] 1950/07/27   CHIEF COMPLAINT: constipation and rectal pain   HISTORY OF PRESENT ILLNESS: Eric Campbell is a 69 year old male with a past medical history of asthma, DM II, sleep apnea uses cpap and colon polyps. He presents to our office today as referred by Dr. Moreen Fowler for further evaluation for constipation and rectal pain. He typically passes a normal brown formed BM once daily. However, one month ago he developed constipation, no BM for 5 days which is quite unusual for him. He took Dulcolax tabs and 1 1/2 days later he passed a large amount of solid to loose stool then no BM for a few days. He then took Maalox every night which resulted in passing a normal formed brown BM once daily. He was concerned about taking Maalox long term so he stopped taking it. He is now passing hard pellet like stools or narrow stools with ridges daily. He sees a small amount of red blood on the toilet tissue once every 3 to 4 weeks, usually if he passes a more difficult painful BM.  He reported completing 2 Cologuard tests in the past, the most recent Cologuard test was about one year ago. He underwent a colonoscopy by Dr. Fuller Plan 10/23/2011 and a 1.5cm lipoma was identified at the hepatic flexure and a 63mm polyp was removed from the descending colon. Biopsies showed benign lymphoid polyp. Diverticulosis to the sigmoid colon was noted.   On exam, he developed abrupt onset of audible wheezing when supine. No respiratory distress. He reports having chronic asthma symptoms for many years which he just deals with. He uses an Albuterol inhaler as needed.  He often wheezes when he lays flat. He has sleep apnea and he is compliant with using it.    Past Medical History:  Diagnosis Date  . Allergic rhinitis   . Asthma   . BPH (benign prostatic hyperplasia)   . Colon polyps   . Congenital hearing loss   . Diabetes mellitus    diet controlled  . Dyslipidemia   . ED (erectile  dysfunction)   . Hyperlipidemia   . Hypertension   . Hypogonadism male   . Obesity   . Osteopenia   . Seasonal allergies   . Sleep apnea    cpap  . Torn ligament    left ankle  . Vitamin D deficiency    Past Surgical History:  Procedure Laterality Date  . ACHILLES TENDON SURGERY Left    ankle  . UMBILICAL HERNIA REPAIR  1998   Social History: He is married. Retired. He has 2 sons and 1 daughter. Nonsmoker. No alcohol use. No drug use.   Family History: family history includes COPD in his father and mother; Cancer (age of onset: 42) in his brother; Diabetes in his sister; Emphysema in his father and mother; Heart attack in his father; Pancreatic cancer (age of onset: 48) in his mother; Ulcerative colitis (age of onset: 17) in his grandchild.   Allergies  Allergen Reactions  . Penicillins Swelling  . Lotrisone [Clotrimazole-Betamethasone] Rash      Outpatient Encounter Medications as of 05/10/2020  Medication Sig  . albuterol (PROVENTIL HFA;VENTOLIN HFA) 108 (90 BASE) MCG/ACT inhaler Inhale 2 puffs into the lungs every 4 (four) hours as needed for wheezing or shortness of breath.  Marland Kitchen alclomethasone (ACLOVATE) 0.05 % cream APPLY TO THE AFFECTED AREA(S) UP TO TWICE DAILY.  Marland Kitchen aspirin 81 MG tablet Take 81 mg by mouth  daily.  . atorvastatin (LIPITOR) 20 MG tablet Take 20 mg by mouth daily.   Marland Kitchen augmented betamethasone dipropionate (DIPROLENE-AF) 0.05 % cream Apply 1 application topically in the morning and at bedtime.  . B Complex-Folic Acid (B COMPLEX-VITAMIN B12 PO) Take 1 tablet by mouth daily.  . Calcium Carbonate 1500 (600 CA) MG TABS Take 1 tablet by mouth daily.   Marland Kitchen losartan (COZAAR) 100 MG tablet Take 100 mg by mouth daily.  . metFORMIN (GLUCOPHAGE) 1000 MG tablet Take 1,000 mg by mouth 2 (two) times daily.  . Omega-3 Fatty Acids (FISH OIL) 1000 MG CAPS Take 1 capsule by mouth daily.  . sildenafil (VIAGRA) 100 MG tablet Take 100 mg by mouth daily as needed for erectile  dysfunction.  . Testosterone 20.25 MG/ACT (1.62%) GEL Apply 2 Pump topically daily.  . Vitamin D, Ergocalciferol, 2000 units CAPS Take 2 capsules by mouth 2 (two) times daily.  . [DISCONTINUED] losartan (COZAAR) 25 MG tablet Take 25 mg by mouth daily.  . [DISCONTINUED] metFORMIN (GLUCOPHAGE) 500 MG tablet Take 500 mg by mouth 2 (two) times daily with a meal.   No facility-administered encounter medications on file as of 05/10/2020.     REVIEW OF SYSTEMS: Gen: Denies fever, sweats or chills. No weight loss.  CV: Denies chest pain, palpitations or edema. Resp: See HPI. GI: See HPI. No GERD symptoms.   GU : Denies urinary burning, blood in urine, increased urinary frequency or incontinence. MS: Denies joint pain, muscles aches or weakness. Derm: Denies rash, itchiness, skin lesions or unhealing ulcers. Psych: Denies anxiety, depression or memory loss.  Heme: Denies bruising, bleeding. Neuro:  Denies headaches, dizziness or paresthesias. Endo:  + DM II.    PHYSICAL EXAM: BP 116/60 (BP Location: Left Arm, Patient Position: Sitting, Cuff Size: Normal)   Pulse 92   Ht $R'5\' 8"'Ct$  (1.727 m) Comment: height measured without shoes  Wt 261 lb 6 oz (118.6 kg)   BMI 39.74 kg/m  General: 69 year old male in no acute distress. Head: Normocephalic and atraumatic. Eyes:  Sclerae non-icteric, conjunctive pink. Ears: Normal auditory acuity. Mouth: No ulcers or lesions.  Neck: Supple, no lymphadenopathy or thyromegaly.  Lungs: Clear bilaterally to auscultation without wheezes, crackles or rhonchi when sitting upright. Diffuse wheezes when he is supine.  Heart: Regular rate and rhythm. No murmur, rub or gallop appreciated.  Abdomen: Soft, nontender, non distended. No masses. No hepatosplenomegaly. Normoactive bowel sounds x 4 quadrants.  Rectal: Anal hemorrhoids inflamed and mildly friable without active bleeding. No mass. Sophia CMA present during exam.  Musculoskeletal: Symmetrical with no gross  deformities. Skin: Warm and dry. No rash or lesions on visible extremities. Extremities: No edema. Neurological: Alert oriented x 4, no focal deficits.  Psychological:  Alert and cooperative. Normal mood and affect.  ASSESSMENT AND PLAN:  4. 69 year old male with constipation and rectal bleeding. -Miralax Q HS -He has listed allergy to topical steroid therefore Preparation H suppository 1 PR QHS x 5 nights and Desitin apply a small amount inside the anal opening and to the external anal area tid x 7 days then as needed -Colonoscopy benefits and risks discussed including risk with sedation, risk of bleeding, perforation and infection . -Colonoscopy to be scheduled after pulmonary evaluation completed  -Request copy of most recent CBC and CMP from PCP's office   2. Asthma, sleep apnea. Significant wheezing when supine. No cough or SOB. -Patient to follow up with PCP, I recommend a pulmonary evaluation prior  to proceeding with a colonoscopy.  3. DM II   ADDENDUM: Labs from PCP 11/05/2019 received: WBC 8.0. Hg 17.5. HCT 50.4. 05/10/2020: Glu 139. BUN 24. Cr. 0.95. Na 138. K 4.1. T. Bili 0.8. Alk phos 69. AT 18. ALT 22. FOBT FIT negative.    ADDENDUM: Pulmonary clearance received by [facility] pulmonary  NP, refer to pulmonary consult note 06/02/2020.    CC:  Antony Contras, MD

## 2020-05-10 NOTE — Patient Instructions (Signed)
We are putting in a referral for [facility] to evaluate you.   Call us after you see Pulmonary to set up a colonoscopy.  Please purchase over the counter Preparation H suppositories and use one at bedtime nightly for five nights.  Apply a small amount of Desitin inside the anal opening three times a day as needed.  Please use a dose of over the counter Miralax at bedtime for constipation.  I appreciate the opportunity to care for you. Lynnea Maizes, CRNP

## 2020-05-11 DIAGNOSIS — K5900 Constipation, unspecified: Secondary | ICD-10-CM | POA: Insufficient documentation

## 2020-05-11 NOTE — Progress Notes (Signed)
Reviewed and agree with management plan.  Kamoni Depree T. Khristi Schiller, MD FACG [facility]  

## 2020-05-23 ENCOUNTER — Telehealth: Payer: Self-pay | Admitting: Nurse Practitioner

## 2020-05-23 NOTE — Telephone Encounter (Signed)
error 

## 2020-06-01 ENCOUNTER — Other Ambulatory Visit: Payer: Self-pay | Admitting: Family Medicine

## 2020-06-01 DIAGNOSIS — M85852 Other specified disorders of bone density and structure, left thigh: Secondary | ICD-10-CM

## 2020-06-02 ENCOUNTER — Ambulatory Visit: Payer: PPO | Admitting: Pulmonary Disease

## 2020-06-02 ENCOUNTER — Other Ambulatory Visit: Payer: Self-pay

## 2020-06-02 ENCOUNTER — Encounter: Payer: Self-pay | Admitting: Pulmonary Disease

## 2020-06-02 ENCOUNTER — Ambulatory Visit (INDEPENDENT_AMBULATORY_CARE_PROVIDER_SITE_OTHER): Payer: PPO

## 2020-06-02 ENCOUNTER — Other Ambulatory Visit: Payer: Self-pay | Admitting: *Deleted

## 2020-06-02 ENCOUNTER — Ambulatory Visit (INDEPENDENT_AMBULATORY_CARE_PROVIDER_SITE_OTHER): Payer: PPO | Admitting: Internal Medicine

## 2020-06-02 VITALS — BP 136/78 | HR 88 | Temp 97.3°F | Ht 69.0 in | Wt 264.0 lb

## 2020-06-02 DIAGNOSIS — J4520 Mild intermittent asthma, uncomplicated: Secondary | ICD-10-CM | POA: Diagnosis not present

## 2020-06-02 DIAGNOSIS — Z7189 Other specified counseling: Secondary | ICD-10-CM | POA: Diagnosis not present

## 2020-06-02 DIAGNOSIS — G4733 Obstructive sleep apnea (adult) (pediatric): Secondary | ICD-10-CM

## 2020-06-02 DIAGNOSIS — R059 Cough, unspecified: Secondary | ICD-10-CM | POA: Diagnosis not present

## 2020-06-02 DIAGNOSIS — J90 Pleural effusion, not elsewhere classified: Secondary | ICD-10-CM | POA: Diagnosis not present

## 2020-06-02 DIAGNOSIS — J309 Allergic rhinitis, unspecified: Secondary | ICD-10-CM | POA: Diagnosis not present

## 2020-06-02 DIAGNOSIS — M7521 Bicipital tendinitis, right shoulder: Secondary | ICD-10-CM | POA: Diagnosis not present

## 2020-06-02 DIAGNOSIS — J4540 Moderate persistent asthma, uncomplicated: Secondary | ICD-10-CM | POA: Diagnosis not present

## 2020-06-02 DIAGNOSIS — J45909 Unspecified asthma, uncomplicated: Secondary | ICD-10-CM | POA: Insufficient documentation

## 2020-06-02 LAB — CBC WITH DIFFERENTIAL/PLATELET
Basophils Absolute: 0.1 10*3/uL (ref 0.0–0.1)
Basophils Relative: 0.7 % (ref 0.0–3.0)
Eosinophils Absolute: 0.4 10*3/uL (ref 0.0–0.7)
Eosinophils Relative: 4.4 % (ref 0.0–5.0)
HCT: 50.9 % (ref 39.0–52.0)
Hemoglobin: 17.6 g/dL — ABNORMAL HIGH (ref 13.0–17.0)
Lymphocytes Relative: 36.8 % (ref 12.0–46.0)
Lymphs Abs: 3.6 10*3/uL (ref 0.7–4.0)
MCHC: 34.6 g/dL (ref 30.0–36.0)
MCV: 88.8 fl (ref 78.0–100.0)
Monocytes Absolute: 0.9 10*3/uL (ref 0.1–1.0)
Monocytes Relative: 9.6 % (ref 3.0–12.0)
Neutro Abs: 4.8 10*3/uL (ref 1.4–7.7)
Neutrophils Relative %: 48.5 % (ref 43.0–77.0)
Platelets: 166 10*3/uL (ref 150.0–400.0)
RBC: 5.74 Mil/uL (ref 4.22–5.81)
RDW: 13.9 % (ref 11.5–15.5)
WBC: 9.8 10*3/uL (ref 4.0–10.5)

## 2020-06-02 LAB — PULMONARY FUNCTION TEST
FEF 25-75 Post: 2.34 L/sec
FEF 25-75 Pre: 0.83 L/sec
FEF2575-%Change-Post: 181 %
FEF2575-%Pred-Post: 100 %
FEF2575-%Pred-Pre: 35 %
FEV1-%Change-Post: 30 %
FEV1-%Pred-Post: 77 %
FEV1-%Pred-Pre: 59 %
FEV1-Post: 2.4 L
FEV1-Pre: 1.84 L
FEV1FVC-%Change-Post: 18 %
FEV1FVC-%Pred-Pre: 85 %
FEV6-%Change-Post: 15 %
FEV6-%Pred-Post: 81 %
FEV6-%Pred-Pre: 70 %
FEV6-Post: 3.25 L
FEV6-Pre: 2.81 L
FEV6FVC-%Change-Post: 3 %
FEV6FVC-%Pred-Post: 106 %
FEV6FVC-%Pred-Pre: 102 %
FVC-%Change-Post: 10 %
FVC-%Pred-Post: 77 %
FVC-%Pred-Pre: 69 %
FVC-Post: 3.26 L
FVC-Pre: 2.94 L
Post FEV1/FVC ratio: 74 %
Post FEV6/FVC ratio: 100 %
Pre FEV1/FVC ratio: 63 %
Pre FEV6/FVC Ratio: 96 %

## 2020-06-02 LAB — NITRIC OXIDE: Nitric Oxide: 96

## 2020-06-02 MED ORDER — BUDESONIDE-FORMOTEROL FUMARATE 80-4.5 MCG/ACT IN AERO: INHALATION_SPRAY | RESPIRATORY_TRACT | 12 refills | Status: AC

## 2020-06-02 NOTE — Progress Notes (Signed)
@Patient  ID: Eric Campbell, male    DOB: 11/18/1950, 69 y.o.   MRN: [id number]  Chief Complaint  Patient presents with  . Follow-up    reports in office for clearance for upcoming colonoscopy. reports feeling at baseline.    Referring provider: Spero Geralds, MD  HPI:  69 year old male never smoker followed in our office for obstructive sleep apnea and asthma  PMH: Obesity Smoker/ Smoking History: Never smoker Maintenance: None Pt of: Dr. Shearon Stalls  06/02/2020  - Visit   69 year old male never smoker found our office for asthma and obstructive sleep apnea. Patient also hoping to obtain colonoscopy and is hoping to receive pulmonary clearance to do this.  Patient reports that he is using CPAP every day.  CPAP compliance report confirms this.  See compliance were listed below:  05/02/2020-05/31/2020-CPAP compliance report-30 out of last 30 days used, 28 of those days greater than 4 hours, average usage 6 hours and 14 minutes, CPAP set pressure of 18, AHI 1.9  Patient reports that in the past he was diagnosed with asthma and he was started on inhalers.  He never found any clinical benefit.  He does have a rescue inhaler which he utilizes maybe 1 time every 2 to 3 months.  He does feel clinical relief when he uses.  He is unable to report if he was fully compliant with his maintenance inhalers when he was trialed on them.  He believes that he was trialed on Symbicort as well as Breo Ellipta.  He is unsure of the dosage.  When patient takes prednisone he feels significantly better.  He feels that all of his symptoms resolved.  We have no baseline lab work or chest imaging.  Patient reports he continues to have an upper airway wheeze.  He is unsure if there is anything we can do to help with management of this.  He also has a significant amount of postnasal drip and allergies.  He reports that he has triggers to his allergies with pressure change as well as season changing.  Patient admits  that he is not very physically active.  He leads a fairly sedentary lifestyle.  Specifically struggles with fast movements or quick pace.  He reports that he is able to do things such as blowing leaves in the yard for hours but if he has to do this at a quick pace or if he has to push himself he will be symptomatic very quickly.  Pre and post spirometry  today showed a positive bronchodilator response in FEV1, also potential restriction.  FeNO today is 57  Questionaires / Pulmonary Flowsheets:   ACT:  Asthma Control Test ACT Total Score  11/10/2019 16    MMRC: No flowsheet data found.  Epworth:  No flowsheet data found.  Tests:   06/02/2020-pulmonary function test-FVC 2.94 (69% addicted), postbronchodilator ratio 74, postbronchodilator FEV1 2.40 (77% predicted), positive bronchodilator response in FEV1  FENO:  Lab Results  Component Value Date   NITRICOXIDE 96 06/02/2020    PFT: PFT Results Latest Ref Rng & Units 06/02/2020  FVC-Pre L 2.94  FVC-Predicted Pre % 69  FVC-Post L 3.26  FVC-Predicted Post % 77  Pre FEV1/FVC % % 63  Post FEV1/FCV % % 74  FEV1-Pre L 1.84  FEV1-Predicted Pre % 59  FEV1-Post L 2.40    WALK:  No flowsheet data found.  Imaging: No results found.  Lab Results:  CBC No results found for: WBC, RBC, HGB, HCT, PLT, MCV, MCH,  MCHC, RDW, LYMPHSABS, MONOABS, EOSABS, BASOSABS  BMET No results found for: NA, K, CL, CO2, GLUCOSE, BUN, CREATININE, CALCIUM, GFRNONAA, GFRAA  BNP No results found for: BNP  ProBNP No results found for: PROBNP  Specialty Problems      Pulmonary Problems   OSA (obstructive sleep apnea)   Allergic rhinitis   Asthma      Allergies  Allergen Reactions  . Penicillins Swelling  . Lotrisone [Clotrimazole-Betamethasone] Rash    Immunization History  Administered Date(s) Administered  . Influenza,inj,Quad PF,6+ Mos 07/02/2019  . PFIZER SARS-COV-2 Vaccination 08/06/2019, 08/27/2019  . Tdap 04/20/2013  . Zoster  04/20/2013    Past Medical History:  Diagnosis Date  . Allergic rhinitis   . Asthma   . BPH (benign prostatic hyperplasia)   . Colon polyps   . Congenital hearing loss   . Diabetes mellitus    diet controlled  . Dyslipidemia   . ED (erectile dysfunction)   . Hyperlipidemia   . Hypertension   . Hypogonadism male   . Obesity   . Osteopenia   . Seasonal allergies   . Sleep apnea    cpap  . Torn ligament    left ankle  . Vitamin D deficiency     Tobacco History: Social History   Tobacco Use  Smoking Status Never Smoker  Smokeless Tobacco Never Used   Counseling given: Not Answered   Continue to not smoke  Outpatient Encounter Medications as of 06/02/2020  Medication Sig  . albuterol (PROVENTIL HFA;VENTOLIN HFA) 108 (90 BASE) MCG/ACT inhaler Inhale 2 puffs into the lungs every 4 (four) hours as needed for wheezing or shortness of breath.  Marland Kitchen alclomethasone (ACLOVATE) 0.05 % cream APPLY TO THE AFFECTED AREA(S) UP TO TWICE DAILY.  Marland Kitchen aspirin 81 MG tablet Take 81 mg by mouth daily.  Marland Kitchen atorvastatin (LIPITOR) 20 MG tablet Take 20 mg by mouth daily.   Marland Kitchen augmented betamethasone dipropionate (DIPROLENE-AF) 0.05 % cream Apply 1 application topically in the morning and at bedtime.  . B Complex-Folic Acid (B COMPLEX-VITAMIN B12 PO) Take 1 tablet by mouth daily.  . Calcium Carbonate 1500 (600 CA) MG TABS Take 1 tablet by mouth daily.   Marland Kitchen losartan (COZAAR) 100 MG tablet Take 100 mg by mouth daily.  . metFORMIN (GLUCOPHAGE) 1000 MG tablet Take 1,000 mg by mouth 2 (two) times daily.  . Omega-3 Fatty Acids (FISH OIL) 1000 MG CAPS Take 1 capsule by mouth daily.  . sildenafil (VIAGRA) 100 MG tablet Take 100 mg by mouth daily as needed for erectile dysfunction.  . Testosterone 20.25 MG/ACT (1.62%) GEL Apply 2 Pump topically daily.  . Vitamin D, Ergocalciferol, 2000 units CAPS Take 2 capsules by mouth 2 (two) times daily.  . budesonide-formoterol (SYMBICORT) 80-4.5 MCG/ACT inhaler 2 puffs  in the morning right when you wake up, rinse out your mouth after use, 12 hours later 2 puffs, rinse after use.Take this daily, no matter what.This is not a rescue inhaler   No facility-administered encounter medications on file as of 06/02/2020.     Review of Systems  Review of Systems  Constitutional: Positive for fatigue. Negative for activity change, chills, fever and unexpected weight change.  HENT: Positive for congestion, hearing loss, postnasal drip and rhinorrhea. Negative for sinus pressure, sinus pain and sore throat.   Eyes: Negative.   Respiratory: Positive for cough and wheezing. Negative for shortness of breath.   Cardiovascular: Negative for chest pain and palpitations.  Gastrointestinal: Negative for constipation, diarrhea, nausea  and vomiting.  Endocrine: Negative.   Genitourinary: Negative.   Musculoskeletal: Negative.   Skin: Negative.   Neurological: Negative for dizziness and headaches.  Psychiatric/Behavioral: Negative.  Negative for dysphoric mood. The patient is not nervous/anxious.   All other systems reviewed and are negative.    Physical Exam  BP 136/78   Pulse 88   Temp (!) 97.3 F (36.3 C)   Ht 5\' 9"  (1.753 m)   Wt 264 lb (119.7 kg)   SpO2 97%   BMI 38.99 kg/m   Wt Readings from Last 5 Encounters:  06/02/20 264 lb (119.7 kg)  05/10/20 261 lb 6 oz (118.6 kg)  11/10/19 264 lb (119.7 kg)  04/21/18 263 lb 1.9 oz (119.4 kg)  03/20/17 269 lb 12.8 oz (122.4 kg)    BMI Readings from Last 5 Encounters:  06/02/20 38.99 kg/m  05/10/20 39.74 kg/m  11/10/19 40.14 kg/m  04/21/18 39.43 kg/m  03/20/17 40.43 kg/m     Physical Exam Vitals and nursing note reviewed.  Constitutional:      General: He is not in acute distress.    Appearance: Normal appearance. He is obese.  HENT:     Head: Normocephalic and atraumatic.     Right Ear: Hearing, tympanic membrane, ear canal and external ear normal. There is impacted cerumen.     Left Ear:  Hearing, tympanic membrane, ear canal and external ear normal. There is impacted cerumen.     Nose: Rhinorrhea present. No mucosal edema.     Right Turbinates: Not enlarged.     Left Turbinates: Not enlarged.     Mouth/Throat:     Mouth: Mucous membranes are dry.     Pharynx: Oropharynx is clear. No oropharyngeal exudate.     Comments: PND Eyes:     Pupils: Pupils are equal, round, and reactive to light.  Cardiovascular:     Rate and Rhythm: Normal rate and regular rhythm.     Pulses: Normal pulses.     Heart sounds: Normal heart sounds. No murmur heard.   Pulmonary:     Effort: Pulmonary effort is normal.     Breath sounds: Normal breath sounds. No decreased breath sounds, wheezing or rales.  Musculoskeletal:     Cervical back: Normal range of motion.     Right lower leg: No edema.     Left lower leg: No edema.  Lymphadenopathy:     Cervical: No cervical adenopathy.  Skin:    General: Skin is warm and dry.     Capillary Refill: Capillary refill takes less than 2 seconds.     Findings: No erythema or rash.  Neurological:     General: No focal deficit present.     Mental Status: He is alert and oriented to person, place, and time.     Motor: No weakness.     Coordination: Coordination normal.     Gait: Gait is intact. Gait normal.  Psychiatric:        Mood and Affect: Mood normal.        Behavior: Behavior normal. Behavior is cooperative.        Thought Content: Thought content normal.        Judgment: Judgment normal.       Assessment & Plan:   Allergic rhinitis Rhinorrhea and postnasal drip on exam today Patient reporting he typically rotates between cetirizine and fexofenadine Currently right now taking fexofenadine Triggers: Change in weather, change in season  Plan: Lab work today Continue daily antihistamine  Can start nasal saline rinses Can also start chlor tabs at night for management of postnasal drip  Asthma Reviewed pre and post spirometry with  patient today showing positive bronchodilator response in FEV1 Reviewed FeNO results today indicating high chance of allergic component to patient's airway Patient unable to report if he adhered to previous recommendations of maintenance inhalers Patient feels significantly better when taking prednisone  Plan: Lab work today Chest x-ray today Trial of Symbicort 80 Continue daily antihistamine 49-month follow-up with Dr. Shearon Stalls    OSA (obstructive sleep apnea) Would like to establish with our pulmonary practice for sleep management Excellent compliance to CPAP therapy  Plan: Proceed forward with CPAP therapy Continue CPAP    Surgical counseling visit  Peri-operative Assessment of Pulmonary Risk for Non-Thoracic Surgery:  ForMr. Mace,  Moderate moderate risk risk of perioperative pulmonary complications is increased by:  Age greater than 65 years  Asthma   Obstructive sleep apnea  Allergic Rhinitis   Respiratory complications generally occur in 1% of ASA Class I patients, 5% of ASA Class II and 10% of ASA Class III-IV patients These complications rarely result in mortality and iclude postoperative pneumonia, atelectasis, pulmonary embolism, ARDS and increased time requiring postoperative mechanical ventilation.  Overall, I recommend proceeding with the surgery if the risk for respiratory complications are outweighed by the potential benefits. This will need to be discussed between the patient and surgeon.  To reduce risks of respiratory complications, I recommend: --Pre- and post-operative incentive spirometry performed frequently while awake --Inpatient use of currently prescribed positive-pressure for OSA whenever the patient is sleeping --Avoiding use of pancuronium during anesthesia.  I have discussed the risk factors and recommendations above with the patient.       Return in about 2 months (around 08/03/2020), or if symptoms worsen or fail to improve, for Follow up  with Dr. Shearon Stalls.   Lauraine Rinne, NP 06/02/2020   This appointment required 45 minutes of patient care (this includes precharting, chart review, review of results, face-to-face care, etc.).

## 2020-06-02 NOTE — Assessment & Plan Note (Signed)
Would like to establish with our pulmonary practice for sleep management Excellent compliance to CPAP therapy  Plan: Proceed forward with CPAP therapy Continue CPAP

## 2020-06-02 NOTE — Progress Notes (Signed)
Spirometry pre and post 

## 2020-06-02 NOTE — Assessment & Plan Note (Signed)
Rhinorrhea and postnasal drip on exam today Patient reporting he typically rotates between cetirizine and fexofenadine Currently right now taking fexofenadine Triggers: Change in weather, change in season  Plan: Lab work today Continue daily antihistamine Can start nasal saline rinses Can also start chlor tabs at night for management of postnasal drip

## 2020-06-02 NOTE — Assessment & Plan Note (Addendum)
Reviewed pre and post spirometry with patient today showing positive bronchodilator response in FEV1 Reviewed FeNO results today indicating high chance of allergic component to patient's airway Patient unable to report if he adhered to previous recommendations of maintenance inhalers Patient feels significantly better when taking prednisone  Plan: Lab work today Chest x-ray today Trial of Symbicort 80 Continue daily antihistamine 15-month follow-up with Dr. Shearon Stalls

## 2020-06-02 NOTE — Assessment & Plan Note (Addendum)
  Peri-operative Assessment of Pulmonary Risk for Non-Thoracic Surgery:  Eric Campbell,  Moderate moderate risk risk of perioperative pulmonary complications is increased by:  Age greater than 65 years  Asthma   Obstructive sleep apnea  Allergic Rhinitis   Respiratory complications generally occur in 1% of ASA Class I patients, 5% of ASA Class II and 10% of ASA Class III-IV patients These complications rarely result in mortality and iclude postoperative pneumonia, atelectasis, pulmonary embolism, ARDS and increased time requiring postoperative mechanical ventilation.  Overall, I recommend proceeding with the surgery if the risk for respiratory complications are outweighed by the potential benefits. This will need to be discussed between the patient and surgeon.  To reduce risks of respiratory complications, I recommend: --Pre- and post-operative incentive spirometry performed frequently while awake --Inpatient use of currently prescribed positive-pressure for OSA whenever the patient is sleeping --Avoiding use of pancuronium during anesthesia.  I have discussed the risk factors and recommendations above with the patient.

## 2020-06-02 NOTE — Patient Instructions (Addendum)
You were seen today by Lauraine Rinne, NP  for:   1. Moderate persistent asthma without complication  - CBC with Differential/Platelet; Future - IgE; Future  Start Symbicort 80 >>> 2 puffs in the morning right when you wake up, rinse out your mouth after use, 12 hours later 2 puffs, rinse after use >>> Take this daily, no matter what >>> This is not a rescue inhaler    2. Allergic rhinitis, unspecified seasonality, unspecified trigger  Continue Allegra  Consider starting nasal saline rinses 1-2 times daily Use distilled water Shake well Get bottle lukewarm like a baby bottle  Consider start taking chlorpheniramine (aka Chlor tabs) 4 mg tablet (1 to 2 tablets at night) for management of allergies and postnasal drip at night >>> This is an over-the-counter medication >>> This medication is sedating   3. OSA (obstructive sleep apnea)  Remain established with Dr. Shearon Stalls  If we start having complex issues with your CPAP may need to utilize one of the for sleep MDs in our office  We recommend that you continue using your CPAP daily >>>Keep up the hard work using your device >>> Goal should be wearing this for the entire night that you are sleeping, at least 4 to 6 hours  Remember:  . Do not drive or operate heavy machinery if tired or drowsy.  . Please notify the supply company and office if you are unable to use your device regularly due to missing supplies or machine being broken.  . Work on maintaining a healthy weight and following your recommended nutrition plan  . Maintain proper daily exercise and movement  . Maintaining proper use of your device can also help improve management of other chronic illnesses such as: Blood pressure, blood sugars, and weight management.   BiPAP/ CPAP Cleaning:  >>>Clean weekly, with Dawn soap, and bottle brush.  Set up to air dry. >>> Wipe mask out daily with wet wipe or towelette   4. Surgical counseling visit  - DG Chest 2 View;  Future - Nitric oxide  Chest Xray today   Walk today   Peri-operative Assessment of Pulmonary Risk for Non-Thoracic Surgery:  ForMr. Haros,  Moderate risk risk of perioperative pulmonary complications is increased by:  Age greater than 64 years  Asthma   Obstructive sleep apnea  Allergic Rhinitis   Respiratory complications generally occur in 1% of ASA Class I patients, 5% of ASA Class II and 10% of ASA Class III-IV patients These complications rarely result in mortality and iclude postoperative pneumonia, atelectasis, pulmonary embolism, ARDS and increased time requiring postoperative mechanical ventilation.  Overall, I recommend proceeding with the surgery if the risk for respiratory complications are outweighed by the potential benefits. This will need to be discussed between the patient and surgeon.  To reduce risks of respiratory complications, I recommend: --Pre- and post-operative incentive spirometry performed frequently while awake --Inpatient use of currently prescribed positive-pressure for OSA whenever the patient is sleeping --Avoiding use of pancuronium during anesthesia.  I have discussed the risk factors and recommendations above with the patient.    We recommend today:  Orders Placed This Encounter  Procedures  . DG Chest 2 View    Standing Status:   Future    Number of Occurrences:   1    Standing Expiration Date:   10/01/2020    Order Specific Question:   Reason for Exam (SYMPTOM  OR DIAGNOSIS REQUIRED)    Answer:   cough, surgical clearence  Order Specific Question:   Preferred imaging location?    Answer:   Internal    Order Specific Question:   Radiology Contrast Protocol - do NOT remove file path    Answer:   [email or web address]  . CBC with Differential/Platelet    Standing Status:   Future    Standing Expiration Date:   06/02/2021  . IgE    Standing Status:   Future    Standing Expiration Date:    06/02/2021  . Nitric oxide   Orders Placed This Encounter  Procedures  . DG Chest 2 View  . CBC with Differential/Platelet  . IgE  . Nitric oxide   Meds ordered this encounter  Medications  . budesonide-formoterol (SYMBICORT) 80-4.5 MCG/ACT inhaler    Sig: 2 puffs in the morning right when you wake up, rinse out your mouth after use, 12 hours later 2 puffs, rinse after use.Take this daily, no matter what.This is not a rescue inhaler    Dispense:  1 each    Refill:  12    Follow Up:    Return in about 2 months (around 08/03/2020), or if symptoms worsen or fail to improve, for Follow up with Dr. Shearon Stalls.   Notification of test results are managed in the following manner: If there are  any recommendations or changes to the  plan of care discussed in office today,  we will contact you and let you know what they are. If you do not hear from [place], then your results are normal and you can view them through your  MyChart account , or a letter will be sent to you. Thank you again for trusting [place] with your care  - Thank you, [facility]    It is flu season:   >>> Best ways to protect herself from the flu: Receive the yearly flu vaccine, practice good hand hygiene washing with soap and also using hand sanitizer when available, eat a nutritious meals, get adequate rest, hydrate appropriately       Please contact the office if your symptoms worsen or you have concerns that you are not improving.   Thank you for choosing [facility] for your healthcare, and for allowing [place] to partner with you on your healthcare journey. I am thankful to be able to provide care to you today.   Wyn Quaker FNP-C

## 2020-06-03 ENCOUNTER — Telehealth: Payer: Self-pay | Admitting: Nurse Practitioner

## 2020-06-03 LAB — IGE: IgE (Immunoglobulin E), Serum: 72 kU/L (ref <=114)

## 2020-06-03 NOTE — Telephone Encounter (Signed)
Patient called stating that he saw his pulmonologist yesterday and was cleared to have colonoscopy.  OV notes from pulmonary are in epic.  Please advise if it's ok to schedule patient.  Thank you.

## 2020-06-06 ENCOUNTER — Encounter: Payer: Self-pay | Admitting: Gastroenterology

## 2020-06-06 ENCOUNTER — Telehealth: Payer: Self-pay | Admitting: Pulmonary Disease

## 2020-06-06 NOTE — Telephone Encounter (Signed)
Would you contact the patient to schedule?

## 2020-06-06 NOTE — Telephone Encounter (Signed)
Beth, pls contact patient to schedule a colonoscopy at [facility] with Dr. Fuller Plan, pulmonary clearance received. Dr. Fuller Plan reviewed, ok to schedule colonoscopy. Refer to 05/10/2020 office visit for orders. thx

## 2020-06-06 NOTE — Telephone Encounter (Signed)
06/06/20   Reviewed lab work with patient.  Reviewed AVS regarding starting Chlortab as well as nasal saline rinses  All questions answered.  Encourage patient keep follow-up with our office.  Patient reports that he is receiving Symbicort 80 in the mail today he plans on starting.  Keep upcoming follow-up with our office.  Nothing further needed.  Wyn Quaker, FNP

## 2020-06-06 NOTE — Telephone Encounter (Signed)
Recall colon scheduled on 07/29/20 at 9:00am at [facility].

## 2020-06-10 DIAGNOSIS — H6122 Impacted cerumen, left ear: Secondary | ICD-10-CM | POA: Diagnosis not present

## 2020-07-07 ENCOUNTER — Other Ambulatory Visit: Payer: Self-pay

## 2020-07-07 ENCOUNTER — Ambulatory Visit (AMBULATORY_SURGERY_CENTER): Payer: Self-pay | Admitting: *Deleted

## 2020-07-07 VITALS — Ht 68.0 in | Wt 261.0 lb

## 2020-07-07 DIAGNOSIS — K5900 Constipation, unspecified: Secondary | ICD-10-CM

## 2020-07-07 DIAGNOSIS — K625 Hemorrhage of anus and rectum: Secondary | ICD-10-CM

## 2020-07-07 MED ORDER — PEG 3350-KCL-NA BICARB-NACL 420 G PO SOLR: 4000.0000 mL | Freq: Once | ORAL | 0 refills | Status: AC

## 2020-07-07 NOTE — Progress Notes (Signed)
No egg or soy allergy known to patient  No issues with past sedation with any surgeries or procedures No intubation problems in the past  No FH of Malignant Hyperthermia No diet pills per patient No home 02 use per patient  No blood thinners per patient  Pt denies issues with constipation currently -states now more soft and regular but can be all over the place , hard to soft  No A fib or A flutter  EMMI video to pt or via MyChart  COVID 19 guidelines implemented in PV today with Pt and RN  Pt is fully vaccinated  for Covid    Due to the COVID-19 pandemic we are asking patients to follow certain guidelines.  Pt aware of COVID protocols and [facility] guidelines

## 2020-07-08 ENCOUNTER — Encounter: Payer: Self-pay | Admitting: Gastroenterology

## 2020-07-11 DIAGNOSIS — G4700 Insomnia, unspecified: Secondary | ICD-10-CM | POA: Diagnosis not present

## 2020-07-11 DIAGNOSIS — E1169 Type 2 diabetes mellitus with other specified complication: Secondary | ICD-10-CM | POA: Diagnosis not present

## 2020-07-11 DIAGNOSIS — M8580 Other specified disorders of bone density and structure, unspecified site: Secondary | ICD-10-CM | POA: Diagnosis not present

## 2020-07-11 DIAGNOSIS — I10 Essential (primary) hypertension: Secondary | ICD-10-CM | POA: Diagnosis not present

## 2020-07-11 DIAGNOSIS — E1165 Type 2 diabetes mellitus with hyperglycemia: Secondary | ICD-10-CM | POA: Diagnosis not present

## 2020-07-11 DIAGNOSIS — E782 Mixed hyperlipidemia: Secondary | ICD-10-CM | POA: Diagnosis not present

## 2020-07-11 DIAGNOSIS — N400 Enlarged prostate without lower urinary tract symptoms: Secondary | ICD-10-CM | POA: Diagnosis not present

## 2020-07-11 DIAGNOSIS — J45909 Unspecified asthma, uncomplicated: Secondary | ICD-10-CM | POA: Diagnosis not present

## 2020-07-20 ENCOUNTER — Ambulatory Visit: Payer: PPO | Admitting: Internal Medicine

## 2020-07-20 ENCOUNTER — Other Ambulatory Visit: Payer: Self-pay

## 2020-07-20 ENCOUNTER — Encounter: Payer: Self-pay | Admitting: Internal Medicine

## 2020-07-20 VITALS — BP 122/64 | HR 79 | Temp 98.0°F | Resp 20 | Ht 68.0 in | Wt 267.2 lb

## 2020-07-20 DIAGNOSIS — G4733 Obstructive sleep apnea (adult) (pediatric): Secondary | ICD-10-CM

## 2020-07-20 DIAGNOSIS — J301 Allergic rhinitis due to pollen: Secondary | ICD-10-CM | POA: Diagnosis not present

## 2020-07-20 DIAGNOSIS — Z9989 Dependence on other enabling machines and devices: Secondary | ICD-10-CM

## 2020-07-20 DIAGNOSIS — J4540 Moderate persistent asthma, uncomplicated: Secondary | ICD-10-CM

## 2020-07-20 NOTE — Progress Notes (Signed)
Carlester Kasparek Grotz    [id number]    1950-10-08  Primary Care Physician:Swayne, Shanon Brow, MD Date of Appointment: 07/20/2020 Established Patient Visit  Chief complaint:   Chief Complaint  Patient presents with  . Follow-up    No new complaints, review CPAP     HPI: Eric Campbell is a 70 y.o. gentleman with asthma and OSA on CPAP. He has a history of passive smoke exposure in childhood. Had asthma symptoms as a young adult with exertion   Interval Updates: Started on symbicort in December by NP. Says he feels completely different and wheezing is gone.   Current Regimen: albuterol prn(uses very infrequently less than once a month), symbicort BID Asthma Triggers: exertion Exacerbations in the last year: none History of hospitalization or intubation: never Allergy Testing: not had GERD: denies Allergic Rhinitis: yes on cetirizine ACT:  Asthma Control Test ACT Total Score  07/20/2020 23  11/10/2019 16   FeNO: 96 ppb in December 2021  I have reviewed the patient's family social and past medical history and updated as appropriate.   Past Medical History:  Diagnosis Date  . Allergic rhinitis   . Allergy   . Asthma   . BPH (benign prostatic hyperplasia)   . Colon polyps   . Congenital hearing loss   . Diabetes mellitus    diet controlled  . Dyslipidemia   . ED (erectile dysfunction)   . Hyperlipidemia   . Hypertension   . Hypogonadism male   . Obesity   . Osteopenia   . Seasonal allergies   . Sleep apnea     wears cpap  . Torn ligament    left ankle  . Vitamin D deficiency     Past Surgical History:  Procedure Laterality Date  . ACHILLES TENDON SURGERY Left    ankle  . COLONOSCOPY    . POLYPECTOMY    . UMBILICAL HERNIA REPAIR  1998    Family History  Problem Relation Age of Onset  . Pancreatic cancer Mother 11  . COPD Mother   . Emphysema Mother   . Other Mother        had Colostomy-   . COPD Father   . Heart attack Father   . Emphysema  Father   . Ulcerative colitis Grandchild 16  . Diabetes Sister   . Cancer Brother 19       type unknown  . Colon cancer Neg Hx   . Stomach cancer Neg Hx   . Rectal cancer Neg Hx   . Colon polyps Neg Hx   . Esophageal cancer Neg Hx     Social History   Occupational History  . Occupation: [employer]/retired  Tobacco Use  . Smoking status: Never Smoker  . Smokeless tobacco: Never Used  Vaping Use  . Vaping Use: Never used  Substance and Sexual Activity  . Alcohol use: Not Currently    Comment: at events like weddings  . Drug use: No  . Sexual activity: Not on file     Physical Exam: Blood pressure 122/64, pulse 79, temperature 98 F (36.7 C), temperature source Skin, resp. rate 20, height 5\' 8"  (1.727 m), weight 267 lb 3.2 oz (121.2 kg), SpO2 99 %.  Gen:      No acute distress, obese ENT:   no nasal polyps, mucus membranes moist Lungs:    No increased respiratory effort, symmetric chest wall excursion, clear to auscultation bilaterally, no wheezes or  crackles CV:         Regular rate and rhythm; no murmurs, rubs, or gallops.  No pedal edema   Data Reviewed: Imaging: I have personally reviewed the chest xray Dece 2021 which shows poor inspiratory effort, otherwise no acute process.   PFTs:  PFT Results Latest Ref Rng & Units 06/02/2020  FVC-Pre L 2.94  FVC-Predicted Pre % 69  FVC-Post L 3.26  FVC-Predicted Post % 77  Pre FEV1/FVC % % 63  Post FEV1/FCV % % 74  FEV1-Pre L 1.84  FEV1-Predicted Pre % 59  FEV1-Post L 2.40   I have personally reviewed the patient's PFTs and moderately severe airflow limitation with a positive BD response  Labs:  Immunization status: Immunization History  Administered Date(s) Administered  . Influenza, Seasonal, Injecte, Preservative Fre 04/01/2020  . Influenza,inj,Quad PF,6+ Mos 07/02/2019  . Moderna SARS-COV2 Booster Vaccination 04/01/2020  . PFIZER(Purple Top)SARS-COV-2 Vaccination 08/06/2019, 08/27/2019  . Tdap  04/20/2013  . Zoster 04/20/2013    Assessment:  Moderate Persistent Asthma - controlled OSA on CPAP - controlled Allergic Rhinitis - not well controlled  Plan/Recommendations:  Continue symbicort and albuterol  CPAP controlled on current symptoms.   Continue cetirizine. Add flonase for rhinitis    Return to Care: Return in about 6 months (around 01/17/2021).   Lenice Llamas, MD Pulmonary and [place]

## 2020-07-20 NOTE — Patient Instructions (Addendum)
The patient should have follow up scheduled with myself in 4 months.   Keep taking symbicort and albuterol.  Take the albuterol rescue inhaler every 4 to 6 hours as needed for wheezing or shortness of breath. You can also take it 15 minutes before exercise or exertional activity. Side effects include heart racing or pounding, jitters or anxiety. If you have a history of an irregular heart rhythm, it can make this worse. Can also give some patients a hard time sleeping.  Check the back of the inhaler to keep track of the total number of doses left on the inhaler.    Flonase - 1 spray on each side of your nose twice a day for first week, then 1 spray on each side.   Instructions for use:  If you also use a saline nasal spray or rinse, use that first.  Position the head with the chin slightly tucked. Use the right hand to spray into the left nostril and the right hand to spray into the left nostril.   Point the bottle away from the septum of your nose (cartilage that divides the two sides of your nose).   Hold the nostril closed on the opposite side from where you will spray  Spray once and gently sniff to pull the medicine into the higher parts of your nose.  Don't sniff too hard as the medicine will drain down the back of your throat instead.  Repeat with a second spray on the same side if prescribed.  Repeat on the other side of your nose.

## 2020-07-29 ENCOUNTER — Ambulatory Visit (AMBULATORY_SURGERY_CENTER): Payer: PPO | Admitting: Gastroenterology

## 2020-07-29 ENCOUNTER — Other Ambulatory Visit: Payer: Self-pay

## 2020-07-29 ENCOUNTER — Encounter: Payer: Self-pay | Admitting: Gastroenterology

## 2020-07-29 VITALS — BP 108/88 | HR 70 | Temp 97.8°F | Resp 18 | Ht 68.0 in | Wt 261.0 lb

## 2020-07-29 DIAGNOSIS — R194 Change in bowel habit: Secondary | ICD-10-CM

## 2020-07-29 DIAGNOSIS — K640 First degree hemorrhoids: Secondary | ICD-10-CM

## 2020-07-29 DIAGNOSIS — D123 Benign neoplasm of transverse colon: Secondary | ICD-10-CM

## 2020-07-29 DIAGNOSIS — D124 Benign neoplasm of descending colon: Secondary | ICD-10-CM | POA: Diagnosis not present

## 2020-07-29 DIAGNOSIS — K5730 Diverticulosis of large intestine without perforation or abscess without bleeding: Secondary | ICD-10-CM

## 2020-07-29 DIAGNOSIS — K625 Hemorrhage of anus and rectum: Secondary | ICD-10-CM | POA: Diagnosis not present

## 2020-07-29 DIAGNOSIS — K921 Melena: Secondary | ICD-10-CM | POA: Diagnosis not present

## 2020-07-29 DIAGNOSIS — K5900 Constipation, unspecified: Secondary | ICD-10-CM | POA: Diagnosis not present

## 2020-07-29 MED ORDER — SODIUM CHLORIDE 0.9 % IV SOLN: 500.0000 mL | Freq: Once | INTRAVENOUS | Status: DC

## 2020-07-29 NOTE — Progress Notes (Signed)
Called to room to assist during endoscopic procedure.  Patient ID and intended procedure confirmed with present staff. Received instructions for my participation in the procedure from the performing physician.  

## 2020-07-29 NOTE — Progress Notes (Signed)
To PACU, VSS. Report to [email or web address] 

## 2020-07-29 NOTE — Progress Notes (Signed)
Medical history reviewed with no changes noted. VS assessed by C.W 

## 2020-07-29 NOTE — Op Note (Signed)
[place] Patient Name: Eric Campbell Procedure Date: 07/29/2020 9:10 AM MRN: [id number] Endoscopist: Ladene Artist , MD Age: 70 Referring MD:  Date of Birth: [birth year] Gender: Male Account #: [id number] Procedure:                Colonoscopy Indications:              Hematochezia, Change in bowel habits Medicines:                Monitored Anesthesia Care Procedure:                Pre-Anesthesia Assessment:                           - Prior to the procedure, a History and Physical                            was performed, and patient medications and                            allergies were reviewed. The patient's tolerance of                            previous anesthesia was also reviewed. The risks                            and benefits of the procedure and the sedation                            options and risks were discussed with the patient.                            All questions were answered, and informed consent                            was obtained. Prior Anticoagulants: The patient has                            taken no previous anticoagulant or antiplatelet                            agents. ASA Grade Assessment: III - A patient with                            severe systemic disease. After reviewing the risks                            and benefits, the patient was deemed in                            satisfactory condition to undergo the procedure.                           After obtaining informed consent, the colonoscope  was passed under direct vision. Throughout the                            procedure, the patient's blood pressure, pulse, and                            oxygen saturations were monitored continuously. The                            Olympus CF-[id number]) Colonoscope was                            introduced through the anus and advanced to the the                            cecum, identified by  appendiceal orifice and                            ileocecal valve. The ileocecal valve, appendiceal                            orifice, and rectum were photographed. The quality                            of the bowel preparation was good. The colonoscopy                            was performed without difficulty. The patient                            tolerated the procedure well. Scope In: 9:15:04 AM Scope Out: 9:32:03 AM Scope Withdrawal Time: 0 hours 15 minutes 36 seconds  Total Procedure Duration: 0 hours 16 minutes 59 seconds  Findings:                 The perianal and digital rectal examinations were                            normal.                           Four sessile polyps were found in the descending                            colon (3) and transverse colon (1). The polyps were                            6 to 8 mm in size. These polyps were removed with a                            cold snare. Resection and retrieval were complete.                           There was a medium-sized lipoma, 15 mm in diameter,  at the hepatic flexure.                           A few medium-mouthed diverticula were found in the                            left colon.                           Internal hemorrhoids were found during                            retroflexion. The hemorrhoids were small and Grade                            I (internal hemorrhoids that do not prolapse).                           The exam was otherwise without abnormality on                            direct and retroflexion views. Complications:            No immediate complications. Estimated blood loss:                            None. Estimated Blood Loss:     Estimated blood loss: none. Impression:               - Four 6 to 8 mm polyps in the descending colon and                            in the transverse colon, removed with a cold snare.                            Resected and  retrieved.                           - Medium-sized lipoma at the hepatic flexure.                           - Mild diverticulosis in the left colon.                           - Internal hemorrhoids.                           - The examination was otherwise normal on direct                            and retroflexion views. Recommendation:           - Repeat colonoscopy after studies are complete for                            surveillance based on pathology results.                           -  Patient has a contact number available for                            emergencies. The signs and symptoms of potential                            delayed complications were discussed with the                            patient. Return to normal activities tomorrow.                            Written discharge instructions were provided to the                            patient.                           - High fiber diet long term.                           - Continue present medications.                           - Prep H supp (OTC) 1 PR qd prn hemorrhoid symptoms.                           - Benefiber daily.                           - Miralax 1 scoop daily.                           - Await pathology results. Meryl Dare, MD 07/29/2020 9:39:38 AM This report has been signed electronically.

## 2020-07-29 NOTE — Patient Instructions (Addendum)
Thank you for allowing [place] to care for you today!  Await pathology results, approximately 7-10 days by mail.    Recommend taking fiber supplement daily ( Metamucil, Fibercon, Benefiber, Konsyl) along with adopting more fiber in your diet through foods.  Handout provided.  Recommend also adding 1 scoop of Miralax to a beverage  daily to aid  in regularity of BMs.  If you can have more frequent and softer BMs your symptoms should improve.  Use Preparation H suppositories daily as needed for hemorrhoid symptoms.  Resume previous diet and other medications today.  Resume your normal daily activities tomorrow.    YOU HAD AN ENDOSCOPIC PROCEDURE TODAY AT [facility]:   Refer to the procedure report that was given to you for any specific questions about what was found during the examination.  If the procedure report does not answer your questions, please call your gastroenterologist to clarify.  If you requested that your care partner not be given the details of your procedure findings, then the procedure report has been included in a sealed envelope for you to review at your convenience later.  YOU SHOULD EXPECT: Some feelings of bloating in the abdomen. Passage of more gas than usual.  Walking can help get rid of the air that was put into your GI tract during the procedure and reduce the bloating. If you had a lower endoscopy (such as a colonoscopy or flexible sigmoidoscopy) you may notice spotting of blood in your stool or on the toilet paper. If you underwent a bowel prep for your procedure, you may not have a normal bowel movement for a few days.  Please Note:  You might notice some irritation and congestion in your nose or some drainage.  This is from the oxygen used during your procedure.  There is no need for concern and it should clear up in a day or so.  SYMPTOMS TO REPORT IMMEDIATELY:   Following lower endoscopy (colonoscopy or flexible sigmoidoscopy):  Excessive amounts  of blood in the stool  Significant tenderness or worsening of abdominal pains  Swelling of the abdomen that is new, acute  Fever of 100F or higher   For urgent or emergent issues, a gastroenterologist can be reached at any hour by calling [phone]. Do not use MyChart messaging for urgent concerns.    DIET:  We do recommend a small meal at first, but then you may proceed to your regular diet.  Drink plenty of fluids but you should avoid alcoholic beverages for 24 hours.  ACTIVITY:  You should plan to take it easy for the rest of today and you should NOT DRIVE or use heavy machinery until tomorrow (because of the sedation medicines used during the test).    FOLLOW UP: Our staff will call the number listed on your records 48-72 hours following your procedure to check on you and address any questions or concerns that you may have regarding the information given to you following your procedure. If we do not reach you, we will leave a message.  We will attempt to reach you two times.  During this call, we will ask if you have developed any symptoms of COVID 19. If you develop any symptoms (ie: fever, flu-like symptoms, shortness of breath, cough etc.) before then, please call [phone].  If you test positive for Covid 19 in the 2 weeks post procedure, please call and report this information to [place].    If any biopsies were taken you will  be contacted by phone or by letter within the next 1-3 weeks.  Please call us at [phone] if you have not heard about the biopsies in 3 weeks.    SIGNATURES/CONFIDENTIALITY: You and/or your care partner have signed paperwork which will be entered into your electronic medical record.  These signatures attest to the fact that that the information above on your After Visit Summary has been reviewed and is understood.  Full responsibility of the confidentiality of this discharge information lies with you and/or your care-partner.

## 2020-08-02 ENCOUNTER — Telehealth: Payer: Self-pay | Admitting: *Deleted

## 2020-08-02 NOTE — Telephone Encounter (Signed)
Attempted f/u phone call. No answer. Left message. °

## 2020-08-02 NOTE — Telephone Encounter (Signed)
  Follow up Call-  Call back number 07/29/2020  Post procedure Call Back phone  # [phone]  Permission to leave phone message Yes  Some recent data might be hidden     Patient questions:  Do you have a fever, pain , or abdominal swelling? No. Pain Score  0 *  Have you tolerated food without any problems? Yes.    Have you been able to return to your normal activities? Yes.    Do you have any questions about your discharge instructions: Diet   No. Medications  No. Follow up visit  No.  Do you have questions or concerns about your Care? No.  Actions: * If pain score is 4 or above: No action needed, pain <4.  1. Have you developed a fever since your procedure? no  2.   Have you had an respiratory symptoms (SOB or cough) since your procedure? no  3.   Have you tested positive for COVID 19 since your procedure no  4.   Have you had any family members/close contacts diagnosed with the COVID 19 since your procedure?  no   If yes to any of these questions please route to Joylene John, RN and Joella Prince, RN

## 2020-08-09 DIAGNOSIS — N400 Enlarged prostate without lower urinary tract symptoms: Secondary | ICD-10-CM | POA: Diagnosis not present

## 2020-08-09 DIAGNOSIS — E1169 Type 2 diabetes mellitus with other specified complication: Secondary | ICD-10-CM | POA: Diagnosis not present

## 2020-08-09 DIAGNOSIS — I10 Essential (primary) hypertension: Secondary | ICD-10-CM | POA: Diagnosis not present

## 2020-08-09 DIAGNOSIS — E1165 Type 2 diabetes mellitus with hyperglycemia: Secondary | ICD-10-CM | POA: Diagnosis not present

## 2020-08-09 DIAGNOSIS — E782 Mixed hyperlipidemia: Secondary | ICD-10-CM | POA: Diagnosis not present

## 2020-08-09 DIAGNOSIS — M8580 Other specified disorders of bone density and structure, unspecified site: Secondary | ICD-10-CM | POA: Diagnosis not present

## 2020-08-09 DIAGNOSIS — G4700 Insomnia, unspecified: Secondary | ICD-10-CM | POA: Diagnosis not present

## 2020-08-09 DIAGNOSIS — J45909 Unspecified asthma, uncomplicated: Secondary | ICD-10-CM | POA: Diagnosis not present

## 2020-08-16 ENCOUNTER — Encounter: Payer: Self-pay | Admitting: Gastroenterology

## 2020-09-08 ENCOUNTER — Other Ambulatory Visit: Payer: PPO

## 2020-09-09 DIAGNOSIS — I10 Essential (primary) hypertension: Secondary | ICD-10-CM | POA: Diagnosis not present

## 2020-09-09 DIAGNOSIS — E1165 Type 2 diabetes mellitus with hyperglycemia: Secondary | ICD-10-CM | POA: Diagnosis not present

## 2020-09-09 DIAGNOSIS — M8580 Other specified disorders of bone density and structure, unspecified site: Secondary | ICD-10-CM | POA: Diagnosis not present

## 2020-09-09 DIAGNOSIS — N400 Enlarged prostate without lower urinary tract symptoms: Secondary | ICD-10-CM | POA: Diagnosis not present

## 2020-09-09 DIAGNOSIS — E1169 Type 2 diabetes mellitus with other specified complication: Secondary | ICD-10-CM | POA: Diagnosis not present

## 2020-09-09 DIAGNOSIS — E782 Mixed hyperlipidemia: Secondary | ICD-10-CM | POA: Diagnosis not present

## 2020-09-09 DIAGNOSIS — J45909 Unspecified asthma, uncomplicated: Secondary | ICD-10-CM | POA: Diagnosis not present

## 2020-09-09 DIAGNOSIS — G4700 Insomnia, unspecified: Secondary | ICD-10-CM | POA: Diagnosis not present

## 2020-09-21 ENCOUNTER — Other Ambulatory Visit: Payer: Self-pay

## 2020-09-21 ENCOUNTER — Ambulatory Visit
Admission: RE | Admit: 2020-09-21 | Discharge: 2020-09-21 | Disposition: A | Discharge status: Home / Self Care | Payer: PPO | Source: Ambulatory Visit | Attending: Family Medicine | Admitting: Family Medicine

## 2020-09-21 DIAGNOSIS — M85832 Other specified disorders of bone density and structure, left forearm: Secondary | ICD-10-CM | POA: Diagnosis not present

## 2020-09-21 DIAGNOSIS — M85852 Other specified disorders of bone density and structure, left thigh: Secondary | ICD-10-CM

## 2020-09-27 DIAGNOSIS — M545 Low back pain, unspecified: Secondary | ICD-10-CM | POA: Diagnosis not present

## 2020-10-11 DIAGNOSIS — M545 Low back pain, unspecified: Secondary | ICD-10-CM | POA: Diagnosis not present

## 2020-10-12 DIAGNOSIS — G4733 Obstructive sleep apnea (adult) (pediatric): Secondary | ICD-10-CM | POA: Diagnosis not present

## 2020-10-17 ENCOUNTER — Telehealth: Payer: Self-pay | Admitting: Podiatry

## 2020-10-17 ENCOUNTER — Other Ambulatory Visit: Payer: Self-pay | Admitting: Sports Medicine

## 2020-10-17 DIAGNOSIS — M545 Low back pain, unspecified: Secondary | ICD-10-CM

## 2020-10-17 NOTE — Telephone Encounter (Signed)
Patient states that Dr. Milinda Pointer preformed ankle surgery on him over 5 years ago. Patient would like to know if metal screws were used to stabilize the ankle (trying to schedule MRI).

## 2020-10-25 ENCOUNTER — Encounter: Payer: Self-pay | Admitting: Podiatry

## 2020-10-25 ENCOUNTER — Ambulatory Visit: Payer: PPO | Admitting: Podiatry

## 2020-10-25 ENCOUNTER — Other Ambulatory Visit: Payer: Self-pay

## 2020-10-25 DIAGNOSIS — E559 Vitamin D deficiency, unspecified: Secondary | ICD-10-CM | POA: Insufficient documentation

## 2020-10-25 DIAGNOSIS — E782 Mixed hyperlipidemia: Secondary | ICD-10-CM | POA: Insufficient documentation

## 2020-10-25 DIAGNOSIS — M79676 Pain in unspecified toe(s): Secondary | ICD-10-CM | POA: Diagnosis not present

## 2020-10-25 DIAGNOSIS — E119 Type 2 diabetes mellitus without complications: Secondary | ICD-10-CM | POA: Diagnosis not present

## 2020-10-25 DIAGNOSIS — N400 Enlarged prostate without lower urinary tract symptoms: Secondary | ICD-10-CM | POA: Insufficient documentation

## 2020-10-25 DIAGNOSIS — N529 Male erectile dysfunction, unspecified: Secondary | ICD-10-CM | POA: Insufficient documentation

## 2020-10-25 DIAGNOSIS — E1165 Type 2 diabetes mellitus with hyperglycemia: Secondary | ICD-10-CM | POA: Insufficient documentation

## 2020-10-25 DIAGNOSIS — G4700 Insomnia, unspecified: Secondary | ICD-10-CM | POA: Insufficient documentation

## 2020-10-25 DIAGNOSIS — M8580 Other specified disorders of bone density and structure, unspecified site: Secondary | ICD-10-CM | POA: Insufficient documentation

## 2020-10-25 DIAGNOSIS — B351 Tinea unguium: Secondary | ICD-10-CM | POA: Diagnosis not present

## 2020-10-25 DIAGNOSIS — E291 Testicular hypofunction: Secondary | ICD-10-CM | POA: Insufficient documentation

## 2020-10-26 DIAGNOSIS — L308 Other specified dermatitis: Secondary | ICD-10-CM | POA: Diagnosis not present

## 2020-10-26 DIAGNOSIS — L281 Prurigo nodularis: Secondary | ICD-10-CM | POA: Diagnosis not present

## 2020-10-26 NOTE — Progress Notes (Signed)
Subjective:  Patient ID: Eric Campbell, male    DOB: [birth year],  MRN: [id number] HPI Chief Complaint  Patient presents with  . Diabetes    Last a1c was "6.?"  . Debridement    Trim toenails    70 y.o. male presents with the above complaint.   ROS: Denies fever chills nausea vomiting muscle aches pains calf pain back pain chest pain shortness of breath.  Past Medical History:  Diagnosis Date  . Allergic rhinitis   . Allergy   . Asthma   . BPH (benign prostatic hyperplasia)   . Colon polyps   . Congenital hearing loss   . Diabetes mellitus    diet controlled  . Dyslipidemia   . ED (erectile dysfunction)   . Hyperlipidemia   . Hypertension   . Hypogonadism male   . Obesity   . Osteopenia   . Seasonal allergies   . Sleep apnea     wears cpap  . Torn ligament    left ankle  . Vitamin D deficiency    Past Surgical History:  Procedure Laterality Date  . ACHILLES TENDON SURGERY Left    ankle  . COLONOSCOPY    . POLYPECTOMY    . UMBILICAL HERNIA REPAIR  1998    Current Outpatient Medications:  .  albuterol (PROVENTIL HFA;VENTOLIN HFA) 108 (90 BASE) MCG/ACT inhaler, Inhale 2 puffs into the lungs every 4 (four) hours as needed for wheezing or shortness of breath. (Patient not taking: Reported on 07/29/2020), Disp: , Rfl:  .  alclomethasone (ACLOVATE) 0.05 % cream, APPLY TO THE AFFECTED AREA(S) UP TO TWICE DAILY., Disp: , Rfl:  .  aspirin 81 MG tablet, Take 81 mg by mouth daily., Disp: , Rfl:  .  atorvastatin (LIPITOR) 20 MG tablet, Take 20 mg by mouth daily. , Disp: , Rfl:  .  augmented betamethasone dipropionate (DIPROLENE-AF) 0.05 % cream, Apply 1 application topically in the morning and at bedtime. (Patient not taking: No sig reported), Disp: , Rfl:  .  B Complex-Folic Acid (B COMPLEX-VITAMIN B12 PO), Take 1 tablet by mouth daily., Disp: , Rfl:  .  BOOSTRIX 5-2.5-18.5 LF-MCG/0.5 injection, , Disp: , Rfl:  .  budesonide-formoterol (SYMBICORT) 80-4.5 MCG/ACT inhaler,  2 puffs in the morning right when you wake up, rinse out your mouth after use, 12 hours later 2 puffs, rinse after use.Take this daily, no matter what.This is not a rescue inhaler, Disp: 1 each, Rfl: 12 .  Calcium Carbonate 1500 (600 CA) MG TABS, Take 1 tablet by mouth daily. , Disp: , Rfl:  .  Continuous Blood Gluc Sensor (FREESTYLE LIBRE 14 DAY SENSOR) MISC, as directed every 2 weeks, Disp: , Rfl:  .  Continuous Blood Gluc Sensor (FREESTYLE LIBRE 14 DAY SENSOR) MISC, Apply topically every 14 (fourteen) days., Disp: , Rfl:  .  ketorolac (TORADOL) 60 MG/2ML SOLN injection, Inject 60 mg into the muscle once., Disp: , Rfl:  .  losartan (COZAAR) 100 MG tablet, Take 100 mg by mouth daily., Disp: , Rfl:  .  metFORMIN (GLUCOPHAGE) 1000 MG tablet, Take 1,000 mg by mouth 2 (two) times daily., Disp: , Rfl:  .  Omega-3 Fatty Acids (FISH OIL) 1000 MG CAPS, Take 1 capsule by mouth daily., Disp: , Rfl:  .  OneTouch Delica Lancets 40J MISC, TEST BLOOD GLUCOSE ONCE DAILY/ DX E11.65, Disp: , Rfl:  .  SHINGRIX injection, , Disp: , Rfl:  .  sildenafil (VIAGRA) 100 MG tablet, Take 100 mg by mouth  daily as needed for erectile dysfunction., Disp: , Rfl:  .  Testosterone 20.25 MG/ACT (1.62%) GEL, Apply 2 Pump topically daily., Disp: , Rfl:  .  Vitamin D, Ergocalciferol, 2000 units CAPS, Take 2 capsules by mouth 2 (two) times daily., Disp: , Rfl:   Allergies  Allergen Reactions  . Penicillins Swelling  . Lotrisone [Clotrimazole-Betamethasone] Rash   Review of Systems Objective:  There were no vitals filed for this visit.  General: Well developed, nourished, in no acute distress, alert and oriented x3   Dermatological: Skin is warm, dry and supple bilateral. Nails x 10 ar are thick yellow dystrophic clinically mycotic; remaining integument appears unremarkable at this time. There are no open sores, no preulcerative lesions, no rash or signs of infection present.  Vascular: Dorsalis Pedis artery and Posterior  Tibial artery pedal pulses are 2/4 bilateral with immedate capillary fill time. Pedal hair growth present. No varicosities and no lower extremity edema present bilateral.   Neruologic: Grossly intact via light touch bilateral. Vibratory intact via tuning fork bilateral. Protective threshold with Semmes Wienstein monofilament intact to all pedal sites bilateral. Patellar and Achilles deep tendon reflexes 2+ bilateral. No Babinski or clonus noted bilateral.   Musculoskeletal: No gross boney pedal deformities bilateral. No pain, crepitus, or limitation noted with foot and ankle range of motion bilateral. Muscular strength 5/5 in all groups tested bilateral.  Well-healed surgical leg and foot left.  Gait: Unassisted, Nonantalgic.    Radiographs:  No radiographs taken today Assessment & Plan:   Assessment: Diabetes mellitus without diabetic complications.  Pain in limb secondary to onychomycosis.  Debride  Plan: Debridement of toenails bilateral     Fount Bahe T. Hyndman, [place]

## 2020-10-30 ENCOUNTER — Other Ambulatory Visit: Payer: PPO

## 2020-10-30 ENCOUNTER — Ambulatory Visit
Admission: RE | Admit: 2020-10-30 | Discharge: 2020-10-30 | Disposition: A | Discharge status: Home / Self Care | Payer: PPO | Source: Ambulatory Visit | Attending: Sports Medicine | Admitting: Sports Medicine

## 2020-10-30 DIAGNOSIS — M545 Low back pain, unspecified: Secondary | ICD-10-CM | POA: Diagnosis not present

## 2020-10-30 DIAGNOSIS — M48061 Spinal stenosis, lumbar region without neurogenic claudication: Secondary | ICD-10-CM | POA: Diagnosis not present

## 2020-11-15 DIAGNOSIS — M48061 Spinal stenosis, lumbar region without neurogenic claudication: Secondary | ICD-10-CM | POA: Diagnosis not present

## 2020-11-24 DIAGNOSIS — E559 Vitamin D deficiency, unspecified: Secondary | ICD-10-CM | POA: Diagnosis not present

## 2020-11-24 DIAGNOSIS — E782 Mixed hyperlipidemia: Secondary | ICD-10-CM | POA: Diagnosis not present

## 2020-11-24 DIAGNOSIS — I10 Essential (primary) hypertension: Secondary | ICD-10-CM | POA: Diagnosis not present

## 2020-11-24 DIAGNOSIS — J45909 Unspecified asthma, uncomplicated: Secondary | ICD-10-CM | POA: Diagnosis not present

## 2020-11-24 DIAGNOSIS — E291 Testicular hypofunction: Secondary | ICD-10-CM | POA: Diagnosis not present

## 2020-11-24 DIAGNOSIS — Z7984 Long term (current) use of oral hypoglycemic drugs: Secondary | ICD-10-CM | POA: Diagnosis not present

## 2020-11-24 DIAGNOSIS — Z0000 Encounter for general adult medical examination without abnormal findings: Secondary | ICD-10-CM | POA: Diagnosis not present

## 2020-11-24 DIAGNOSIS — M48062 Spinal stenosis, lumbar region with neurogenic claudication: Secondary | ICD-10-CM | POA: Diagnosis not present

## 2020-11-24 DIAGNOSIS — Z1389 Encounter for screening for other disorder: Secondary | ICD-10-CM | POA: Diagnosis not present

## 2020-11-24 DIAGNOSIS — N529 Male erectile dysfunction, unspecified: Secondary | ICD-10-CM | POA: Diagnosis not present

## 2020-11-24 DIAGNOSIS — E1169 Type 2 diabetes mellitus with other specified complication: Secondary | ICD-10-CM | POA: Diagnosis not present

## 2020-12-01 DIAGNOSIS — G4700 Insomnia, unspecified: Secondary | ICD-10-CM | POA: Diagnosis not present

## 2020-12-01 DIAGNOSIS — I10 Essential (primary) hypertension: Secondary | ICD-10-CM | POA: Diagnosis not present

## 2020-12-01 DIAGNOSIS — E1165 Type 2 diabetes mellitus with hyperglycemia: Secondary | ICD-10-CM | POA: Diagnosis not present

## 2020-12-01 DIAGNOSIS — N400 Enlarged prostate without lower urinary tract symptoms: Secondary | ICD-10-CM | POA: Diagnosis not present

## 2020-12-01 DIAGNOSIS — E1169 Type 2 diabetes mellitus with other specified complication: Secondary | ICD-10-CM | POA: Diagnosis not present

## 2020-12-01 DIAGNOSIS — M8580 Other specified disorders of bone density and structure, unspecified site: Secondary | ICD-10-CM | POA: Diagnosis not present

## 2020-12-01 DIAGNOSIS — E782 Mixed hyperlipidemia: Secondary | ICD-10-CM | POA: Diagnosis not present

## 2020-12-01 DIAGNOSIS — J45909 Unspecified asthma, uncomplicated: Secondary | ICD-10-CM | POA: Diagnosis not present

## 2020-12-13 DIAGNOSIS — M48061 Spinal stenosis, lumbar region without neurogenic claudication: Secondary | ICD-10-CM | POA: Diagnosis not present

## 2020-12-13 DIAGNOSIS — M5416 Radiculopathy, lumbar region: Secondary | ICD-10-CM | POA: Diagnosis not present

## 2021-01-12 DIAGNOSIS — M48061 Spinal stenosis, lumbar region without neurogenic claudication: Secondary | ICD-10-CM | POA: Insufficient documentation

## 2021-01-12 DIAGNOSIS — M5416 Radiculopathy, lumbar region: Secondary | ICD-10-CM | POA: Diagnosis not present

## 2021-01-19 DIAGNOSIS — G4700 Insomnia, unspecified: Secondary | ICD-10-CM | POA: Diagnosis not present

## 2021-01-19 DIAGNOSIS — J45909 Unspecified asthma, uncomplicated: Secondary | ICD-10-CM | POA: Diagnosis not present

## 2021-01-19 DIAGNOSIS — M8580 Other specified disorders of bone density and structure, unspecified site: Secondary | ICD-10-CM | POA: Diagnosis not present

## 2021-01-19 DIAGNOSIS — E782 Mixed hyperlipidemia: Secondary | ICD-10-CM | POA: Diagnosis not present

## 2021-01-19 DIAGNOSIS — N400 Enlarged prostate without lower urinary tract symptoms: Secondary | ICD-10-CM | POA: Diagnosis not present

## 2021-01-19 DIAGNOSIS — E1165 Type 2 diabetes mellitus with hyperglycemia: Secondary | ICD-10-CM | POA: Diagnosis not present

## 2021-01-19 DIAGNOSIS — I10 Essential (primary) hypertension: Secondary | ICD-10-CM | POA: Diagnosis not present

## 2021-01-19 DIAGNOSIS — E1169 Type 2 diabetes mellitus with other specified complication: Secondary | ICD-10-CM | POA: Diagnosis not present

## 2021-01-25 ENCOUNTER — Ambulatory Visit: Payer: PPO | Admitting: Podiatry

## 2021-01-26 ENCOUNTER — Other Ambulatory Visit: Payer: Self-pay

## 2021-01-26 ENCOUNTER — Ambulatory Visit (INDEPENDENT_AMBULATORY_CARE_PROVIDER_SITE_OTHER): Payer: PPO | Admitting: Podiatry

## 2021-01-26 DIAGNOSIS — E119 Type 2 diabetes mellitus without complications: Secondary | ICD-10-CM | POA: Diagnosis not present

## 2021-01-26 DIAGNOSIS — B351 Tinea unguium: Secondary | ICD-10-CM | POA: Diagnosis not present

## 2021-01-26 DIAGNOSIS — M79676 Pain in unspecified toe(s): Secondary | ICD-10-CM | POA: Diagnosis not present

## 2021-01-26 NOTE — Progress Notes (Signed)
He presents today chief complaint of painful elongated toenails.  He has considerable swelling of the bilateral lower extremities changes noticed last night.  Relating it to a back injection that he had just a couple of days ago.  He states that his breathing seems a little bit more labored than what it has been but it is barely noticeable to him.  Objective: Vital signs are stable he is alert and oriented x3.  I noticed that his breathing is labored today and he does have pitting edema to the bilateral lower extremity left greater than right.  He has no pain on palpation of his calves.  He has no hotspots in the calves.  He has then tenderness on palpation of the foot bilaterally.  His toenails are long thick yellow dystrophic-like mycotic no open lesions or wounds are noted.  Assessment: Pain limb secondary to onychomycosis.  Bilateral lower extremity edema possibly associated with cardiac issues or pulmonary issues.  I would like for him to follow-up with his primary doctor or his cardiologist.   Plan: Debrided his nails and like for him to follow-up with his primary care doctor or his cardiologist as soon as possible.

## 2021-02-03 ENCOUNTER — Ambulatory Visit: Payer: Self-pay | Admitting: Cardiology

## 2021-02-03 NOTE — Progress Notes (Deleted)
 Follow up visit  Subjective:   Eric Campbell, male    DOB: 06/03/1951, 70 y.o.   MRN: [id number]    *** HPI  No chief complaint on file.   70 y.o. Caucasian last male with hypertension, hyperlipidemia, type 2 DM, obesity, OSA on CPAP  *** ***  *** Current Outpatient Medications on File Prior to Visit  Medication Sig Dispense Refill   albuterol (PROVENTIL HFA;VENTOLIN HFA) 108 (90 BASE) MCG/ACT inhaler Inhale 2 puffs into the lungs every 4 (four) hours as needed for wheezing or shortness of breath. (Patient not taking: Reported on 07/29/2020)     alclomethasone (ACLOVATE) 0.05 % cream APPLY TO THE AFFECTED AREA(S) UP TO TWICE DAILY.     aspirin 81 MG tablet Take 81 mg by mouth daily.     atorvastatin (LIPITOR) 20 MG tablet Take 20 mg by mouth daily.      augmented betamethasone dipropionate (DIPROLENE-AF) 0.05 % cream Apply 1 application topically in the morning and at bedtime. (Patient not taking: No sig reported)     B Complex-Folic Acid (B COMPLEX-VITAMIN B12 PO) Take 1 tablet by mouth daily.     BOOSTRIX 5-2.5-18.5 LF-MCG/0.5 injection      budesonide-formoterol (SYMBICORT) 80-4.5 MCG/ACT inhaler 2 puffs in the morning right when you wake up, rinse out your mouth after use, 12 hours later 2 puffs, rinse after use.Take this daily, no matter what.This is not a rescue inhaler 1 each 12   Calcium Carbonate 1500 (600 CA) MG TABS Take 1 tablet by mouth daily.      Continuous Blood Gluc Sensor (FREESTYLE LIBRE 14 DAY SENSOR) MISC as directed every 2 weeks     Continuous Blood Gluc Sensor (FREESTYLE LIBRE 14 DAY SENSOR) MISC Apply topically every 14 (fourteen) days.     ketorolac (TORADOL) 60 MG/2ML SOLN injection Inject 60 mg into the muscle once.     losartan (COZAAR) 100 MG tablet Take 100 mg by mouth daily.     metFORMIN (GLUCOPHAGE) 1000 MG tablet Take 1,000 mg by mouth 2 (two) times daily.     Omega-3 Fatty Acids (FISH OIL) 1000 MG CAPS Take 1 capsule by mouth daily.      OneTouch Delica Lancets 33G MISC TEST BLOOD GLUCOSE ONCE DAILY/ DX E11.65     SHINGRIX injection      sildenafil (VIAGRA) 100 MG tablet Take 100 mg by mouth daily as needed for erectile dysfunction.     Testosterone 20.25 MG/ACT (1.62%) GEL Apply 2 Pump topically daily.     Vitamin D, Ergocalciferol, 2000 units CAPS Take 2 capsules by mouth 2 (two) times daily.     No current facility-administered medications on file prior to visit.    Cardiovascular & other pertient studies:  *** EKG ***/***/202***: ***  Echocardiogram 08/2018: Mod LVH. EF 60-65%. Indeterminate diastolic filling pattern. No significant valvular abnormality. Normal right atrial pressure  Exercise treadmill stress test 07/2018: 7.9 METS.  124% of MPHR. No EKG evidence of ischemia  Recent labs: ***/***/202***: Glucose ***, BUN/Cr ***/***. EGFR ***. Na/K ***/***. ***Rest of the CMP normal H/H ***/***. MCV ***. Platelets *** ***HbA1C ***% Chol ***, TG ***, HDL ***, LDL *** ***TSH ***normal   *** ROS      *** There were no vitals filed for this visit.  There is no height or weight on file to calculate BMI. There were no vitals filed for this visit.  *** Objective:   Physical Exam          Assessment & Recommendations:   ***  ***    Nigel Mormon, MD Pager: [phone] Office: [phone]

## 2021-02-07 ENCOUNTER — Ambulatory Visit: Payer: Self-pay | Admitting: Cardiology

## 2021-02-08 ENCOUNTER — Encounter: Payer: Self-pay | Admitting: Cardiology

## 2021-02-08 ENCOUNTER — Other Ambulatory Visit: Payer: Self-pay

## 2021-02-08 ENCOUNTER — Ambulatory Visit: Payer: PPO | Admitting: Cardiology

## 2021-02-08 ENCOUNTER — Other Ambulatory Visit: Payer: PPO

## 2021-02-08 VITALS — BP 130/77 | HR 80 | Temp 98.4°F | Resp 16 | Ht 68.0 in | Wt 263.0 lb

## 2021-02-08 DIAGNOSIS — R600 Localized edema: Secondary | ICD-10-CM | POA: Diagnosis not present

## 2021-02-08 NOTE — Progress Notes (Signed)
Follow up visit  Subjective:   Eric Campbell, male    DOB: [birth year], 70 y.o.   MRN: [id number]  Chief Complaint  Patient presents with   Edema   Hypertension    HPI   70 y.o. Caucasian last male with hypertension, hyperlipidemia, type 2 DM, obesity, OSA on CPAP, now with leg edema  Patient was last seen by me in 2020.  Echocardiogram and exercise treadmill stress test at that time were unremarkable, details below. Recently, patient had an episode of bilateral leg swelling, lasting only for few days. It improved after he changed his diet. This coincided with improvement in his blood sugar levels. He denies chest pain, shortness of breath, palpitations, leg edema, orthopnea, PND, TIA/syncope. His physical activity is limited due to back pain. He has pain in both his calves, only at night. He denies any claudciation symptoms.     Current Outpatient Medications on File Prior to Visit  Medication Sig Dispense Refill   albuterol (PROVENTIL HFA;VENTOLIN HFA) 108 (90 BASE) MCG/ACT inhaler Inhale 2 puffs into the lungs every 4 (four) hours as needed for wheezing or shortness of breath. (Patient not taking: Reported on 07/29/2020)     alclomethasone (ACLOVATE) 0.05 % cream APPLY TO THE AFFECTED AREA(S) UP TO TWICE DAILY.     aspirin 81 MG tablet Take 81 mg by mouth daily.     atorvastatin (LIPITOR) 20 MG tablet Take 20 mg by mouth daily.      augmented betamethasone dipropionate (DIPROLENE-AF) 0.05 % cream Apply 1 application topically in the morning and at bedtime. (Patient not taking: No sig reported)     B Complex-Folic Acid (B COMPLEX-VITAMIN B12 PO) Take 1 tablet by mouth daily.     BOOSTRIX 5-2.5-18.5 LF-MCG/0.5 injection      budesonide-formoterol (SYMBICORT) 80-4.5 MCG/ACT inhaler 2 puffs in the morning right when you wake up, rinse out your mouth after use, 12 hours later 2 puffs, rinse after use.Take this daily, no matter what.This is not a rescue inhaler 1 each 12   Calcium  Carbonate 1500 (600 CA) MG TABS Take 1 tablet by mouth daily.      Continuous Blood Gluc Sensor (FREESTYLE LIBRE 14 DAY SENSOR) MISC as directed every 2 weeks     Continuous Blood Gluc Sensor (FREESTYLE LIBRE 14 DAY SENSOR) MISC Apply topically every 14 (fourteen) days.     ketorolac (TORADOL) 60 MG/2ML SOLN injection Inject 60 mg into the muscle once.     losartan (COZAAR) 100 MG tablet Take 100 mg by mouth daily.     metFORMIN (GLUCOPHAGE) 1000 MG tablet Take 1,000 mg by mouth 2 (two) times daily.     Omega-3 Fatty Acids (FISH OIL) 1000 MG CAPS Take 1 capsule by mouth daily.     OneTouch Delica Lancets 01S MISC TEST BLOOD GLUCOSE ONCE DAILY/ DX E11.65     SHINGRIX injection      sildenafil (VIAGRA) 100 MG tablet Take 100 mg by mouth daily as needed for erectile dysfunction.     Testosterone 20.25 MG/ACT (1.62%) GEL Apply 2 Pump topically daily.     Vitamin D, Ergocalciferol, 2000 units CAPS Take 2 capsules by mouth 2 (two) times daily.     No current facility-administered medications on file prior to visit.    Cardiovascular & other pertient studies:  EKG 02/08/2021: Sinus rhythm 75 bpm Normal EKG  Echocardiogram 08/2018: Mod LVH. EF 60-65%. Indeterminate diastolic filling pattern. No significant valvular abnormality. Normal right atrial pressure  Exercise treadmill stress test 07/2018: 7.9 METS.  124% of MPHR. No EKG evidence of ischemia  Recent labs: 11/24/2020: Glucose 160, BUN/Cr 22/1.0. EGFR 76.  HbA1C 7.2% Chol 121, TG 105, HDL 39, LDL 62 TSH 1.8 normal    Review of Systems  Cardiovascular:  Negative for chest pain, dyspnea on exertion, leg swelling, palpitations and syncope.  Neurological:  Positive for dizziness.        Vitals:   02/08/21 1416  BP: 130/77  Pulse: 80  Resp: 16  Temp: 98.4 F (36.9 C)  SpO2: 94%    Body mass index is 39.99 kg/m. Filed Weights   02/08/21 1416  Weight: 263 lb (119.3 kg)     Objective:   Physical Exam Vitals and  nursing note reviewed.  Constitutional:      General: He is not in acute distress.    Appearance: He is obese.  Neck:     Vascular: No JVD.  Cardiovascular:     Rate and Rhythm: Normal rate and regular rhythm.     Pulses: Normal pulses.     Heart sounds: Normal heart sounds. No murmur heard. Pulmonary:     Effort: Pulmonary effort is normal.     Breath sounds: Normal breath sounds. No wheezing or rales.  Musculoskeletal:     Right lower leg: No edema.     Left lower leg: No edema.          Assessment & Recommendations:   70 y.o. Caucasian last male with hypertension, hyperlipidemia, type 2 DM, obesity, OSA on CPAP, now with leg edema  Leg edema: Transient, completely resolved after dietary changes. No other s/s of heart failure. Previously, unremarkable echocardiogram in 2020. Monitor for now. Recommend low salt diet.   Leg pain not likely due to PAD, given normal vascular exam.  On a separate note, do not recommend Aspirin for primary prevention given his recent h/o colon polyps.   F/u as needed    Nigel Mormon, MD Pager: [phone] Office: [phone]

## 2021-02-10 DIAGNOSIS — M48061 Spinal stenosis, lumbar region without neurogenic claudication: Secondary | ICD-10-CM | POA: Diagnosis not present

## 2021-02-10 DIAGNOSIS — I10 Essential (primary) hypertension: Secondary | ICD-10-CM | POA: Diagnosis present

## 2021-02-10 DIAGNOSIS — M5416 Radiculopathy, lumbar region: Secondary | ICD-10-CM | POA: Diagnosis not present

## 2021-02-23 DIAGNOSIS — G4700 Insomnia, unspecified: Secondary | ICD-10-CM | POA: Diagnosis not present

## 2021-02-23 DIAGNOSIS — E782 Mixed hyperlipidemia: Secondary | ICD-10-CM | POA: Diagnosis not present

## 2021-02-23 DIAGNOSIS — N400 Enlarged prostate without lower urinary tract symptoms: Secondary | ICD-10-CM | POA: Diagnosis not present

## 2021-02-23 DIAGNOSIS — J45909 Unspecified asthma, uncomplicated: Secondary | ICD-10-CM | POA: Diagnosis not present

## 2021-02-23 DIAGNOSIS — M8580 Other specified disorders of bone density and structure, unspecified site: Secondary | ICD-10-CM | POA: Diagnosis not present

## 2021-02-23 DIAGNOSIS — E1169 Type 2 diabetes mellitus with other specified complication: Secondary | ICD-10-CM | POA: Diagnosis not present

## 2021-02-23 DIAGNOSIS — E1165 Type 2 diabetes mellitus with hyperglycemia: Secondary | ICD-10-CM | POA: Diagnosis not present

## 2021-02-23 DIAGNOSIS — I10 Essential (primary) hypertension: Secondary | ICD-10-CM | POA: Diagnosis not present

## 2021-03-01 DIAGNOSIS — M7061 Trochanteric bursitis, right hip: Secondary | ICD-10-CM | POA: Diagnosis not present

## 2021-03-01 DIAGNOSIS — M7582 Other shoulder lesions, left shoulder: Secondary | ICD-10-CM | POA: Diagnosis not present

## 2021-03-16 DIAGNOSIS — M5416 Radiculopathy, lumbar region: Secondary | ICD-10-CM | POA: Diagnosis not present

## 2021-03-20 ENCOUNTER — Ambulatory Visit
Admission: EM | Admit: 2021-03-20 | Discharge: 2021-03-20 | Disposition: A | Discharge status: Other Acute Inpatient Hospital | Payer: PPO

## 2021-03-20 ENCOUNTER — Other Ambulatory Visit: Payer: Self-pay

## 2021-03-20 DIAGNOSIS — U071 COVID-19: Secondary | ICD-10-CM | POA: Diagnosis not present

## 2021-04-12 DIAGNOSIS — I10 Essential (primary) hypertension: Secondary | ICD-10-CM | POA: Diagnosis not present

## 2021-04-12 DIAGNOSIS — M48061 Spinal stenosis, lumbar region without neurogenic claudication: Secondary | ICD-10-CM | POA: Diagnosis not present

## 2021-04-12 DIAGNOSIS — M5416 Radiculopathy, lumbar region: Secondary | ICD-10-CM | POA: Diagnosis not present

## 2021-04-12 DIAGNOSIS — Z6837 Body mass index (BMI) 37.0-37.9, adult: Secondary | ICD-10-CM | POA: Diagnosis not present

## 2021-04-14 DIAGNOSIS — E1165 Type 2 diabetes mellitus with hyperglycemia: Secondary | ICD-10-CM | POA: Diagnosis not present

## 2021-04-14 DIAGNOSIS — Z23 Encounter for immunization: Secondary | ICD-10-CM | POA: Diagnosis not present

## 2021-04-14 DIAGNOSIS — M48061 Spinal stenosis, lumbar region without neurogenic claudication: Secondary | ICD-10-CM | POA: Diagnosis not present

## 2021-04-14 DIAGNOSIS — E1169 Type 2 diabetes mellitus with other specified complication: Secondary | ICD-10-CM | POA: Diagnosis not present

## 2021-04-14 DIAGNOSIS — R42 Dizziness and giddiness: Secondary | ICD-10-CM | POA: Diagnosis not present

## 2021-04-14 DIAGNOSIS — R609 Edema, unspecified: Secondary | ICD-10-CM | POA: Diagnosis not present

## 2021-04-14 DIAGNOSIS — J45909 Unspecified asthma, uncomplicated: Secondary | ICD-10-CM | POA: Diagnosis not present

## 2021-04-14 DIAGNOSIS — Z8616 Personal history of COVID-19: Secondary | ICD-10-CM | POA: Diagnosis not present

## 2021-04-14 DIAGNOSIS — I10 Essential (primary) hypertension: Secondary | ICD-10-CM | POA: Diagnosis not present

## 2021-04-14 DIAGNOSIS — N400 Enlarged prostate without lower urinary tract symptoms: Secondary | ICD-10-CM | POA: Diagnosis not present

## 2021-04-14 DIAGNOSIS — K5900 Constipation, unspecified: Secondary | ICD-10-CM | POA: Diagnosis not present

## 2021-04-14 DIAGNOSIS — G4700 Insomnia, unspecified: Secondary | ICD-10-CM | POA: Diagnosis not present

## 2021-04-14 DIAGNOSIS — R059 Cough, unspecified: Secondary | ICD-10-CM | POA: Diagnosis not present

## 2021-04-14 DIAGNOSIS — E782 Mixed hyperlipidemia: Secondary | ICD-10-CM | POA: Diagnosis not present

## 2021-04-14 DIAGNOSIS — M8580 Other specified disorders of bone density and structure, unspecified site: Secondary | ICD-10-CM | POA: Diagnosis not present

## 2021-04-20 DIAGNOSIS — M21372 Foot drop, left foot: Secondary | ICD-10-CM | POA: Diagnosis not present

## 2021-04-20 DIAGNOSIS — M21371 Foot drop, right foot: Secondary | ICD-10-CM | POA: Diagnosis not present

## 2021-04-20 DIAGNOSIS — M21379 Foot drop, unspecified foot: Secondary | ICD-10-CM | POA: Insufficient documentation

## 2021-04-20 DIAGNOSIS — M48061 Spinal stenosis, lumbar region without neurogenic claudication: Secondary | ICD-10-CM | POA: Diagnosis not present

## 2021-04-25 ENCOUNTER — Encounter: Payer: Self-pay | Admitting: Podiatry

## 2021-04-25 ENCOUNTER — Other Ambulatory Visit: Payer: Self-pay

## 2021-04-25 ENCOUNTER — Ambulatory Visit: Payer: PPO | Admitting: Podiatry

## 2021-04-25 DIAGNOSIS — M79676 Pain in unspecified toe(s): Secondary | ICD-10-CM

## 2021-04-25 DIAGNOSIS — B351 Tinea unguium: Secondary | ICD-10-CM | POA: Diagnosis not present

## 2021-04-25 DIAGNOSIS — R600 Localized edema: Secondary | ICD-10-CM | POA: Diagnosis not present

## 2021-04-25 DIAGNOSIS — E119 Type 2 diabetes mellitus without complications: Secondary | ICD-10-CM | POA: Diagnosis not present

## 2021-04-25 NOTE — Progress Notes (Signed)
This patient returns to my office for at risk foot care.  This patient requires this care by a professional since this patient will be at risk due to having type 2 diabetes and leg swelling.  This patient is unable to cut nails himself since the patient cannot reach his nails.These nails are painful walking and wearing shoes.  This patient presents for at risk foot care today.  General Appearance  Alert, conversant and in no acute stress.  Vascular  Dorsalis pedis and posterior tibial  pulses are palpable  left foot.  Dorsalis pedis and posterior tibial pulses are weakly palpable due to leg swelling..  Capillary return is within normal limits  bilaterally. Temperature is within normal limits  bilaterally.  Neurologic  Senn-Weinstein monofilament wire test within normal limits  bilaterally. Muscle power within normal limits bilaterally.  Nails Thick disfigured discolored nails with subungual debris  from hallux to fifth toes bilaterally. No evidence of bacterial infection or drainage bilaterally.  Orthopedic  No limitations of motion  feet .  No crepitus or effusions noted.  No bony pathology or digital deformities noted.  Skin  normotropic skin with no porokeratosis noted bilaterally.  No signs of infections or ulcers noted.     Onychomycosis  Pain in right toes  Pain in left toes  Consent was obtained for treatment procedures.   Mechanical debridement of nails 1-5  bilaterally performed with a nail nipper.  Filed with dremel without incident.  Told him to make an appointment concerning his unilateral swelling right leg.   Return office visit   3 months                  Told patient to return for periodic foot care and evaluation due to potential at risk complications.   Gardiner Barefoot DPM

## 2021-04-27 DIAGNOSIS — M48061 Spinal stenosis, lumbar region without neurogenic claudication: Secondary | ICD-10-CM | POA: Diagnosis not present

## 2021-04-28 ENCOUNTER — Other Ambulatory Visit (HOSPITAL_BASED_OUTPATIENT_CLINIC_OR_DEPARTMENT_OTHER): Payer: Self-pay | Admitting: Family Medicine

## 2021-04-28 ENCOUNTER — Ambulatory Visit (HOSPITAL_BASED_OUTPATIENT_CLINIC_OR_DEPARTMENT_OTHER)
Admission: RE | Admit: 2021-04-28 | Discharge: 2021-04-28 | Disposition: A | Discharge status: Home / Self Care | Payer: PPO | Source: Ambulatory Visit | Attending: Family Medicine | Admitting: Family Medicine

## 2021-04-28 ENCOUNTER — Inpatient Hospital Stay (HOSPITAL_BASED_OUTPATIENT_CLINIC_OR_DEPARTMENT_OTHER)
Admission: EM | Admit: 2021-04-28 | Discharge: 2021-04-30 | DRG: 176 | Disposition: A | Discharge status: Home / Self Care | Payer: PPO | Attending: Student | Admitting: Student

## 2021-04-28 ENCOUNTER — Encounter (HOSPITAL_BASED_OUTPATIENT_CLINIC_OR_DEPARTMENT_OTHER): Payer: Self-pay | Admitting: Emergency Medicine

## 2021-04-28 ENCOUNTER — Other Ambulatory Visit: Payer: Self-pay

## 2021-04-28 ENCOUNTER — Emergency Department (HOSPITAL_BASED_OUTPATIENT_CLINIC_OR_DEPARTMENT_OTHER): Payer: PPO

## 2021-04-28 DIAGNOSIS — R600 Localized edema: Secondary | ICD-10-CM | POA: Insufficient documentation

## 2021-04-28 DIAGNOSIS — Z7984 Long term (current) use of oral hypoglycemic drugs: Secondary | ICD-10-CM | POA: Diagnosis not present

## 2021-04-28 DIAGNOSIS — Z833 Family history of diabetes mellitus: Secondary | ICD-10-CM

## 2021-04-28 DIAGNOSIS — Z86010 Personal history of colonic polyps: Secondary | ICD-10-CM

## 2021-04-28 DIAGNOSIS — E1165 Type 2 diabetes mellitus with hyperglycemia: Secondary | ICD-10-CM | POA: Diagnosis present

## 2021-04-28 DIAGNOSIS — E785 Hyperlipidemia, unspecified: Secondary | ICD-10-CM | POA: Diagnosis not present

## 2021-04-28 DIAGNOSIS — E669 Obesity, unspecified: Secondary | ICD-10-CM | POA: Diagnosis present

## 2021-04-28 DIAGNOSIS — Z79899 Other long term (current) drug therapy: Secondary | ICD-10-CM | POA: Diagnosis not present

## 2021-04-28 DIAGNOSIS — I82411 Acute embolism and thrombosis of right femoral vein: Secondary | ICD-10-CM | POA: Diagnosis present

## 2021-04-28 DIAGNOSIS — M48061 Spinal stenosis, lumbar region without neurogenic claudication: Secondary | ICD-10-CM | POA: Diagnosis not present

## 2021-04-28 DIAGNOSIS — Z7951 Long term (current) use of inhaled steroids: Secondary | ICD-10-CM

## 2021-04-28 DIAGNOSIS — I2609 Other pulmonary embolism with acute cor pulmonale: Secondary | ICD-10-CM | POA: Diagnosis not present

## 2021-04-28 DIAGNOSIS — R918 Other nonspecific abnormal finding of lung field: Secondary | ICD-10-CM | POA: Diagnosis present

## 2021-04-28 DIAGNOSIS — I2699 Other pulmonary embolism without acute cor pulmonale: Secondary | ICD-10-CM | POA: Diagnosis present

## 2021-04-28 DIAGNOSIS — E291 Testicular hypofunction: Secondary | ICD-10-CM | POA: Diagnosis not present

## 2021-04-28 DIAGNOSIS — Z800 Family history of malignant neoplasm of digestive organs: Secondary | ICD-10-CM | POA: Diagnosis not present

## 2021-04-28 DIAGNOSIS — I10 Essential (primary) hypertension: Secondary | ICD-10-CM | POA: Diagnosis not present

## 2021-04-28 DIAGNOSIS — I82409 Acute embolism and thrombosis of unspecified deep veins of unspecified lower extremity: Secondary | ICD-10-CM | POA: Diagnosis present

## 2021-04-28 DIAGNOSIS — J45909 Unspecified asthma, uncomplicated: Secondary | ICD-10-CM | POA: Diagnosis present

## 2021-04-28 DIAGNOSIS — I2694 Multiple subsegmental pulmonary emboli without acute cor pulmonale: Secondary | ICD-10-CM | POA: Diagnosis not present

## 2021-04-28 DIAGNOSIS — Z880 Allergy status to penicillin: Secondary | ICD-10-CM

## 2021-04-28 DIAGNOSIS — I82441 Acute embolism and thrombosis of right tibial vein: Secondary | ICD-10-CM | POA: Diagnosis not present

## 2021-04-28 DIAGNOSIS — J4540 Moderate persistent asthma, uncomplicated: Secondary | ICD-10-CM | POA: Diagnosis present

## 2021-04-28 DIAGNOSIS — Z825 Family history of asthma and other chronic lower respiratory diseases: Secondary | ICD-10-CM

## 2021-04-28 DIAGNOSIS — M7989 Other specified soft tissue disorders: Secondary | ICD-10-CM | POA: Diagnosis present

## 2021-04-28 DIAGNOSIS — Z888 Allergy status to other drugs, medicaments and biological substances status: Secondary | ICD-10-CM

## 2021-04-28 DIAGNOSIS — Z20822 Contact with and (suspected) exposure to covid-19: Secondary | ICD-10-CM | POA: Diagnosis not present

## 2021-04-28 DIAGNOSIS — G4733 Obstructive sleep apnea (adult) (pediatric): Secondary | ICD-10-CM | POA: Diagnosis not present

## 2021-04-28 DIAGNOSIS — R911 Solitary pulmonary nodule: Secondary | ICD-10-CM | POA: Diagnosis not present

## 2021-04-28 DIAGNOSIS — N400 Enlarged prostate without lower urinary tract symptoms: Secondary | ICD-10-CM | POA: Diagnosis not present

## 2021-04-28 DIAGNOSIS — M5416 Radiculopathy, lumbar region: Secondary | ICD-10-CM | POA: Diagnosis not present

## 2021-04-28 DIAGNOSIS — Z6836 Body mass index (BMI) 36.0-36.9, adult: Secondary | ICD-10-CM

## 2021-04-28 DIAGNOSIS — G8929 Other chronic pain: Secondary | ICD-10-CM | POA: Diagnosis present

## 2021-04-28 DIAGNOSIS — Z8249 Family history of ischemic heart disease and other diseases of the circulatory system: Secondary | ICD-10-CM

## 2021-04-28 DIAGNOSIS — I824Y1 Acute embolism and thrombosis of unspecified deep veins of right proximal lower extremity: Secondary | ICD-10-CM | POA: Diagnosis not present

## 2021-04-28 LAB — CBC WITH DIFFERENTIAL/PLATELET
Abs Immature Granulocytes: 0.09 10*3/uL — ABNORMAL HIGH (ref 0.00–0.07)
Basophils Absolute: 0.1 10*3/uL (ref 0.0–0.1)
Basophils Relative: 1 %
Eosinophils Absolute: 0.1 10*3/uL (ref 0.0–0.5)
Eosinophils Relative: 2 %
HCT: 40.8 % (ref 39.0–52.0)
Hemoglobin: 14.4 g/dL (ref 13.0–17.0)
Immature Granulocytes: 1 %
Lymphocytes Relative: 31 %
Lymphs Abs: 2.3 10*3/uL (ref 0.7–4.0)
MCH: 31.3 pg (ref 26.0–34.0)
MCHC: 35.3 g/dL (ref 30.0–36.0)
MCV: 88.7 fL (ref 80.0–100.0)
Monocytes Absolute: 0.8 10*3/uL (ref 0.1–1.0)
Monocytes Relative: 11 %
Neutro Abs: 4.1 10*3/uL (ref 1.7–7.7)
Neutrophils Relative %: 54 %
Platelets: 218 10*3/uL (ref 150–400)
RBC: 4.6 MIL/uL (ref 4.22–5.81)
RDW: 14.6 % (ref 11.5–15.5)
WBC: 7.5 10*3/uL (ref 4.0–10.5)
nRBC: 0 % (ref 0.0–0.2)

## 2021-04-28 LAB — RESP PANEL BY RT-PCR (FLU A&B, COVID) ARPGX2
Influenza A by PCR: NEGATIVE
Influenza B by PCR: NEGATIVE
SARS Coronavirus 2 by RT PCR: NEGATIVE

## 2021-04-28 LAB — BASIC METABOLIC PANEL
Anion gap: 7 (ref 5–15)
BUN: 11 mg/dL (ref 8–23)
CO2: 27 mmol/L (ref 22–32)
Calcium: 9.5 mg/dL (ref 8.9–10.3)
Chloride: 106 mmol/L (ref 98–111)
Creatinine, Ser: 0.97 mg/dL (ref 0.61–1.24)
GFR, Estimated: >60 mL/min (ref >60)
Glucose, Bld: 101 mg/dL — ABNORMAL HIGH (ref 70–99)
Potassium: 3.9 mmol/L (ref 3.5–5.1)
Sodium: 140 mmol/L (ref 135–145)

## 2021-04-28 LAB — TROPONIN I (HIGH SENSITIVITY)
Troponin I (High Sensitivity): 10 ng/L (ref <18)
Troponin I (High Sensitivity): 7 ng/L (ref <18)

## 2021-04-28 MED ORDER — IOHEXOL 350 MG/ML SOLN
100.0000 mL | Freq: Once | INTRAVENOUS | Status: AC | PRN
  Administered 2021-04-28: 64 mL via INTRAVENOUS

## 2021-04-28 MED ORDER — ATORVASTATIN CALCIUM 10 MG PO TABS: 20.0000 mg | ORAL_TABLET | Freq: Every day | ORAL | Status: DC

## 2021-04-28 MED ORDER — ALBUTEROL SULFATE (2.5 MG/3ML) 0.083% IN NEBU: 2.5000 mg | INHALATION_SOLUTION | RESPIRATORY_TRACT | Status: DC | PRN

## 2021-04-28 MED ORDER — ALBUTEROL SULFATE HFA 108 (90 BASE) MCG/ACT IN AERS: 2.0000 | INHALATION_SPRAY | RESPIRATORY_TRACT | Status: DC | PRN

## 2021-04-28 MED ORDER — HEPARIN (PORCINE) 25000 UT/250ML-% IV SOLN
1800.0000 [IU]/h | INTRAVENOUS | Status: AC
  Administered 2021-04-28: 1600 [IU]/h via INTRAVENOUS
  Filled 2021-04-28 (×2): qty 250

## 2021-04-28 MED ORDER — METFORMIN HCL 500 MG PO TABS: 1000.0000 mg | ORAL_TABLET | Freq: Two times a day (BID) | ORAL | Status: DC

## 2021-04-28 MED ORDER — LOSARTAN POTASSIUM 50 MG PO TABS: 100.0000 mg | ORAL_TABLET | Freq: Every day | ORAL | Status: DC

## 2021-04-28 MED ORDER — MOMETASONE FURO-FORMOTEROL FUM 100-5 MCG/ACT IN AERO
2.0000 | INHALATION_SPRAY | Freq: Two times a day (BID) | RESPIRATORY_TRACT | Status: DC
Start: 2021-04-28 — End: 2021-04-29
  Filled 2021-04-28: qty 8.8

## 2021-04-28 MED ORDER — HEPARIN BOLUS VIA INFUSION
6500.0000 [IU] | Freq: Once | INTRAVENOUS | Status: AC
  Administered 2021-04-28: 6500 [IU] via INTRAVENOUS

## 2021-04-28 NOTE — Plan of Care (Addendum)
70 yo M with RLE DVT and multiple segmental PEs with RHS.  Heparin gtt.  Inpatient progressive.  [facility] will assume care on arrival to accepting facility. Until arrival, care as per EDP. However, [facility] available 24/7 for questions and assistance.  Nursing staff, please page [facility] and Consults [phone]) as soon as the patient arrives the hospital.

## 2021-04-28 NOTE — ED Notes (Signed)
Called [facility] to advise that patient has a bed ready, [facility] 3E-02, CM, IV (Heparin)  21:23

## 2021-04-28 NOTE — ED Notes (Signed)
Patient transported to CT 

## 2021-04-28 NOTE — ED Triage Notes (Signed)
Pt arrives to ED with c/o right right leg x1 week. Pt reports he had an outpatient [place] that showed an acute DVT in the right leg.
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
## 2021-04-28 NOTE — ED Notes (Signed)
ED Provider at bedside. 

## 2021-04-28 NOTE — Progress Notes (Signed)
ANTICOAGULATION CONSULT NOTE - Initial Consult  Pharmacy Consult for heparin Indication: pulmonary embolus  Allergies  Allergen Reactions   Penicillins Swelling   Lotrisone [Clotrimazole-Betamethasone] Rash    Patient Measurements: Height: 5\' 8"  (172.7 cm) Weight: 112 kg (247 lb) IBW/kg (Calculated) : 68.4 Heparin Dosing Weight: 93.5 kg  Vital Signs: Temp: 97.8 F (36.6 C) (10/28 1439) BP: 134/85 (10/28 1815) Pulse Rate: 89 (10/28 1815)  Labs: Recent Labs    04/28/21 1719  HGB 14.4  HCT 40.8  PLT 218  CREATININE 0.97    Estimated Creatinine Clearance: 83.5 mL/min (by C-G formula based on SCr of 0.97 mg/dL).   Medical History: Past Medical History:  Diagnosis Date   Allergic rhinitis    Allergy    Asthma    BPH (benign prostatic hyperplasia)    Colon polyps    Congenital hearing loss    Diabetes mellitus    diet controlled   Dyslipidemia    ED (erectile dysfunction)    Hyperlipidemia    Hypertension    Hypogonadism male    Obesity    Osteopenia    Seasonal allergies    Sleep apnea     wears cpap   Torn ligament    left ankle   Vitamin D deficiency     Medications: see MAR  Assessment: 70 yo M with acute PE w/ RHS. Heparin for AC. CBC wnl. No AC PTA.  Goal of Therapy:  Heparin level 0.3-0.7 units/ml Monitor platelets by anticoagulation protocol: Yes   Plan:  Give 6500 units bolus x 1 Start heparin infusion at 1600 units/hr Check anti-Xa level in 8 hours and daily while on heparin Continue to monitor H&H and platelets  Joetta Manners, PharmD, [facility] Emergency Medicine Clinical Pharmacist ED RPh Phone: [place]: [phone]

## 2021-04-29 ENCOUNTER — Inpatient Hospital Stay (HOSPITAL_COMMUNITY): Payer: PPO

## 2021-04-29 DIAGNOSIS — Z880 Allergy status to penicillin: Secondary | ICD-10-CM | POA: Diagnosis not present

## 2021-04-29 DIAGNOSIS — Z825 Family history of asthma and other chronic lower respiratory diseases: Secondary | ICD-10-CM | POA: Diagnosis not present

## 2021-04-29 DIAGNOSIS — Z7951 Long term (current) use of inhaled steroids: Secondary | ICD-10-CM | POA: Diagnosis not present

## 2021-04-29 DIAGNOSIS — I2609 Other pulmonary embolism with acute cor pulmonale: Secondary | ICD-10-CM

## 2021-04-29 DIAGNOSIS — J4540 Moderate persistent asthma, uncomplicated: Secondary | ICD-10-CM | POA: Diagnosis present

## 2021-04-29 DIAGNOSIS — Z86010 Personal history of colonic polyps: Secondary | ICD-10-CM | POA: Diagnosis not present

## 2021-04-29 DIAGNOSIS — I2694 Multiple subsegmental pulmonary emboli without acute cor pulmonale: Secondary | ICD-10-CM | POA: Diagnosis present

## 2021-04-29 DIAGNOSIS — M5416 Radiculopathy, lumbar region: Secondary | ICD-10-CM | POA: Diagnosis present

## 2021-04-29 DIAGNOSIS — I82409 Acute embolism and thrombosis of unspecified deep veins of unspecified lower extremity: Secondary | ICD-10-CM | POA: Diagnosis present

## 2021-04-29 DIAGNOSIS — I82411 Acute embolism and thrombosis of right femoral vein: Secondary | ICD-10-CM | POA: Diagnosis present

## 2021-04-29 DIAGNOSIS — Z79899 Other long term (current) drug therapy: Secondary | ICD-10-CM | POA: Diagnosis not present

## 2021-04-29 DIAGNOSIS — I824Y1 Acute embolism and thrombosis of unspecified deep veins of right proximal lower extremity: Secondary | ICD-10-CM | POA: Diagnosis not present

## 2021-04-29 DIAGNOSIS — N400 Enlarged prostate without lower urinary tract symptoms: Secondary | ICD-10-CM | POA: Diagnosis present

## 2021-04-29 DIAGNOSIS — G4733 Obstructive sleep apnea (adult) (pediatric): Secondary | ICD-10-CM | POA: Diagnosis present

## 2021-04-29 DIAGNOSIS — Z833 Family history of diabetes mellitus: Secondary | ICD-10-CM | POA: Diagnosis not present

## 2021-04-29 DIAGNOSIS — Z8249 Family history of ischemic heart disease and other diseases of the circulatory system: Secondary | ICD-10-CM | POA: Diagnosis not present

## 2021-04-29 DIAGNOSIS — Z6836 Body mass index (BMI) 36.0-36.9, adult: Secondary | ICD-10-CM | POA: Diagnosis not present

## 2021-04-29 DIAGNOSIS — Z7984 Long term (current) use of oral hypoglycemic drugs: Secondary | ICD-10-CM | POA: Diagnosis not present

## 2021-04-29 DIAGNOSIS — I10 Essential (primary) hypertension: Secondary | ICD-10-CM

## 2021-04-29 DIAGNOSIS — E291 Testicular hypofunction: Secondary | ICD-10-CM | POA: Diagnosis present

## 2021-04-29 DIAGNOSIS — I2699 Other pulmonary embolism without acute cor pulmonale: Secondary | ICD-10-CM | POA: Diagnosis present

## 2021-04-29 DIAGNOSIS — E1165 Type 2 diabetes mellitus with hyperglycemia: Secondary | ICD-10-CM | POA: Diagnosis present

## 2021-04-29 DIAGNOSIS — G8929 Other chronic pain: Secondary | ICD-10-CM | POA: Diagnosis present

## 2021-04-29 DIAGNOSIS — E785 Hyperlipidemia, unspecified: Secondary | ICD-10-CM | POA: Diagnosis present

## 2021-04-29 DIAGNOSIS — R918 Other nonspecific abnormal finding of lung field: Secondary | ICD-10-CM

## 2021-04-29 DIAGNOSIS — E669 Obesity, unspecified: Secondary | ICD-10-CM | POA: Diagnosis present

## 2021-04-29 DIAGNOSIS — Z800 Family history of malignant neoplasm of digestive organs: Secondary | ICD-10-CM | POA: Diagnosis not present

## 2021-04-29 DIAGNOSIS — M48061 Spinal stenosis, lumbar region without neurogenic claudication: Secondary | ICD-10-CM | POA: Diagnosis present

## 2021-04-29 DIAGNOSIS — Z20822 Contact with and (suspected) exposure to covid-19: Secondary | ICD-10-CM | POA: Diagnosis present

## 2021-04-29 LAB — GLUCOSE, CAPILLARY
Glucose-Capillary: 212 mg/dL — ABNORMAL HIGH (ref 70–99)
Glucose-Capillary: 119 mg/dL — ABNORMAL HIGH (ref 70–99)
Glucose-Capillary: 122 mg/dL — ABNORMAL HIGH (ref 70–99)
Glucose-Capillary: 123 mg/dL — ABNORMAL HIGH (ref 70–99)

## 2021-04-29 LAB — HEMOGLOBIN A1C
Hgb A1c MFr Bld: 7.1 % — ABNORMAL HIGH (ref 4.8–5.6)
Mean Plasma Glucose: 157.07 mg/dL

## 2021-04-29 LAB — ECHOCARDIOGRAM COMPLETE
AR max vel: 2.21 cm2
AV Area VTI: 1.99 cm2
AV Area mean vel: 2.08 cm2
AV Mean grad: 6 mmHg
AV Peak grad: 11.8 mmHg
Ao pk vel: 1.72 m/s
Area-P 1/2: 3.51 cm2
Calc EF: 56.8 %
Height: 69 in
MV VTI: 2.55 cm2
S' Lateral: 2.5 cm
Single Plane A2C EF: 55.1 %
Single Plane A4C EF: 56 %
Weight: 3961.6 oz

## 2021-04-29 LAB — CBC
HCT: 38.1 % — ABNORMAL LOW (ref 39.0–52.0)
Hemoglobin: 13.8 g/dL (ref 13.0–17.0)
MCH: 31.8 pg (ref 26.0–34.0)
MCHC: 36.2 g/dL — ABNORMAL HIGH (ref 30.0–36.0)
MCV: 87.8 fL (ref 80.0–100.0)
Platelets: 222 10*3/uL (ref 150–400)
RBC: 4.34 MIL/uL (ref 4.22–5.81)
RDW: 14.1 % (ref 11.5–15.5)
WBC: 7.8 10*3/uL (ref 4.0–10.5)
nRBC: 0 % (ref 0.0–0.2)

## 2021-04-29 LAB — HEPARIN LEVEL (UNFRACTIONATED)
Heparin Unfractionated: 0.28 IU/mL — ABNORMAL LOW (ref 0.30–0.70)
Heparin Unfractionated: 0.45 IU/mL (ref 0.30–0.70)

## 2021-04-29 LAB — ANTITHROMBIN III: AntiThromb III Func: 120 % (ref 75–120)

## 2021-04-29 LAB — BRAIN NATRIURETIC PEPTIDE: B Natriuretic Peptide: 46.7 pg/mL (ref 0.0–100.0)

## 2021-04-29 MED ORDER — LORATADINE 10 MG PO TABS
10.0000 mg | ORAL_TABLET | Freq: Every day | ORAL | Status: DC | PRN
  Administered 2021-04-29: 10 mg via ORAL
  Filled 2021-04-29: qty 1

## 2021-04-29 MED ORDER — INSULIN ASPART 100 UNIT/ML IJ SOLN
0.0000 [IU] | Freq: Three times a day (TID) | INTRAMUSCULAR | Status: DC
  Administered 2021-04-29: 3 [IU] via SUBCUTANEOUS
  Administered 2021-04-29 – 2021-04-30 (×2): 1 [IU] via SUBCUTANEOUS

## 2021-04-29 MED ORDER — LOSARTAN POTASSIUM 50 MG PO TABS
50.0000 mg | ORAL_TABLET | Freq: Every day | ORAL | Status: DC
  Administered 2021-04-29 – 2021-04-30 (×2): 50 mg via ORAL
  Filled 2021-04-29 (×2): qty 1

## 2021-04-29 MED ORDER — INSULIN ASPART 100 UNIT/ML IJ SOLN: 0.0000 [IU] | Freq: Every day | INTRAMUSCULAR | Status: DC

## 2021-04-29 MED ORDER — MOMETASONE FURO-FORMOTEROL FUM 100-5 MCG/ACT IN AERO
2.0000 | INHALATION_SPRAY | Freq: Two times a day (BID) | RESPIRATORY_TRACT | Status: DC
  Administered 2021-04-29 – 2021-04-30 (×2): 2 via RESPIRATORY_TRACT
  Filled 2021-04-29: qty 8.8

## 2021-04-29 MED ORDER — ATORVASTATIN CALCIUM 10 MG PO TABS
20.0000 mg | ORAL_TABLET | Freq: Every day | ORAL | Status: DC
  Administered 2021-04-29 – 2021-04-30 (×2): 20 mg via ORAL
  Filled 2021-04-29 (×2): qty 2

## 2021-04-29 MED ORDER — HEPARIN BOLUS VIA INFUSION
1500.0000 [IU] | Freq: Once | INTRAVENOUS | Status: AC
  Administered 2021-04-29: 1500 [IU] via INTRAVENOUS
  Filled 2021-04-29: qty 1500

## 2021-04-29 NOTE — Progress Notes (Signed)
PROGRESS NOTE  Eric Campbell LKT:[id number] DOB: 1951-05-22   PCP: Antony Contras, MD  Patient is from: Home.  Uses cane at baseline.  DOA: 04/28/2021 LOS: 0  Chief complaints:  Chief Complaint  Patient presents with   DVT     Brief Narrative / Interim history: 70 year old M with moderate persistent asthma, OSA on CPAP, NIDDM-2, HTN, HLD, morbid obesity, BPH, lower back pain with radiculopathy, left lower extremity weakness, hypogonadism and allergic rhinitis presented to PCP with RLE swelling for 1 week and found to have DVT, and sent to ED for further evaluation. In ED, CTA chest with segmental and subsegmental PE in all 5 lobes with RV to LV ratio of 1.1.  Patient was started on IV heparin.  TTE ordered.  No history of significant immobility, travel, prior history of blood clot or malignancy.  Reportedly had colonoscopy about a year and half ago.  He states that 3 precancerous polyps resected at that time.  Subjective: No major events overnight of this morning.  Did not sleep well without his CPAP.  Has shortness of breath which is at baseline.  He denies chest pain, dizziness, palpitation, GI or UTI symptoms.  He says right leg swelling has improved.  Objective: Vitals:   04/29/21 0115 04/29/21 0201 04/29/21 0342 04/29/21 0929  BP: (!) 136/91 (!) 150/86 120/77 (!) 142/86  Pulse: 83 76 83 84  Resp: 17 (!) 21 19 18   Temp:  97.7 F (36.5 C) 97.8 F (36.6 C) 98 F (36.7 C)  TempSrc:  Oral Oral Oral  SpO2: 99% 99% 94% 97%  Weight:  112.3 kg    Height:  5\' 9"  (1.753 m)      Intake/Output Summary (Last 24 hours) at 04/29/2021 1316 Last data filed at 04/29/2021 0900 Gross per 24 hour  Intake 930.05 ml  Output 600 ml  Net 330.05 ml   Filed Weights   04/28/21 1443 04/29/21 0201  Weight: 112 kg 112.3 kg    Examination:  GENERAL: No apparent distress.  Nontoxic. HEENT: MMM.  Vision and hearing grossly intact.  NECK: Supple.  Difficult to assess JVD due to body  habitus. RESP: 97% on RA.  No IWOB.  Fair aeration bilaterally. CVS:  RRR. Heart sounds normal.  ABD/GI/GU: BS+. Abd soft, NTND.  MSK/EXT:  Moves extremities. No apparent deformity.  RLE swelling/edema. SKIN: no apparent skin lesion or wound NEURO: Awake, alert and oriented appropriately.  LLE weakness (about 3+/5), chronic per patient. PSYCH: Calm. Normal affect.   Procedures:  None  Microbiology summarized: [id number] and influenza PCR nonreactive.  Assessment & Plan: Acute pulmonary embolism with radiologic evidence of right heart strain/RLE DVT-seems unprovoked but lung nodules on CT.  He also reports 3 precancerous colon polyps resection about a year and half ago. -TTE with LVEF of 60 to 65% and no RV strain. -Continue IV heparin -Follow hypercoagulable labs.  Moderate persistent asthma: At baseline. -Continue home breathing treatments  OSA on CPAP -Nightly CPAP ordered.  Uncontrolled NIDDM-2 with hyperglycemia and hyperlipidemia: A1c 7.1%.  On metformin at home. Recent Labs  Lab 04/29/21 0606 04/29/21 1100  GLUCAP 212* 119*  -Continue SSI -Continue home Lipitor  Incidental pulmonary nodule: A 1.2 x 0.9 cm subpleural RLL and 1 x 0.7 cm subpleural RUL nodules noted -Noncontrast CT in 3 to 6 months is recommended.  Essential hypertension: BP slightly elevated. -Resume home losartan at 50 mg daily  Hyperlipidemia -Continue Lipitor as above  Chronic back pain/lumbar stenosis with radiculopathy/LLE weakness -  Followed by neurosurgery. -PT/OT eval  Morbid obesity Body mass index is 36.56 kg/m.  -Encourage lifestyle change to lose weight       DVT prophylaxis:    On IV heparin for PE/DVT  Code Status: Full code Family Communication: Patient and/or RN. Available if any question.  Level of care: Progressive Status is: Inpatient  Remains inpatient appropriate because: IV heparin for PE with right heart strain and DVT    Consultants:  None   Sch Meds:   Scheduled Meds:  atorvastatin  20 mg Oral Daily   insulin aspart  0-5 Units Subcutaneous QHS   insulin aspart  0-9 Units Subcutaneous TID WC   losartan  50 mg Oral Daily   mometasone-formoterol  2 puff Inhalation BID   Continuous Infusions:  heparin 1,800 Units/hr (04/29/21 0501)   PRN Meds:.albuterol  Antimicrobials: Anti-infectives (From admission, onward)    None        I have personally reviewed the following labs and images: CBC: Recent Labs  Lab 04/28/21 1719 04/29/21 0314  WBC 7.5 7.8  NEUTROABS 4.1  --   HGB 14.4 13.8  HCT 40.8 38.1*  MCV 88.7 87.8  PLT 218 222   BMP &GFR Recent Labs  Lab 04/28/21 1719  NA 140  K 3.9  CL 106  CO2 27  GLUCOSE 101*  BUN 11  CREATININE 0.97  CALCIUM 9.5   Estimated Creatinine Clearance: 85 mL/min (by C-G formula based on SCr of 0.97 mg/dL). Liver & Pancreas: No results for input(s): AST, ALT, ALKPHOS, BILITOT, PROT, ALBUMIN in the last 168 hours. No results for input(s): LIPASE, AMYLASE in the last 168 hours. No results for input(s): AMMONIA in the last 168 hours. Diabetic: Recent Labs    04/29/21 0320  HGBA1C 7.1*   Recent Labs  Lab 04/29/21 0606 04/29/21 1100  GLUCAP 212* 119*   Cardiac Enzymes: No results for input(s): CKTOTAL, CKMB, CKMBINDEX, TROPONINI in the last 168 hours. No results for input(s): PROBNP in the last 8760 hours. Coagulation Profile: No results for input(s): INR, PROTIME in the last 168 hours. Thyroid Function Tests: No results for input(s): TSH, T4TOTAL, FREET4, T3FREE, THYROIDAB in the last 72 hours. Lipid Profile: No results for input(s): CHOL, HDL, LDLCALC, TRIG, CHOLHDL, LDLDIRECT in the last 72 hours. Anemia Panel: No results for input(s): VITAMINB12, FOLATE, FERRITIN, TIBC, IRON, RETICCTPCT in the last 72 hours. Urine analysis: No results found for: COLORURINE, APPEARANCEUR, LABSPEC, PHURINE, GLUCOSEU, HGBUR, BILIRUBINUR, KETONESUR, PROTEINUR, UROBILINOGEN, NITRITE,  LEUKOCYTESUR Sepsis Labs: Invalid input(s): PROCALCITONIN, [place]  Microbiology: Recent Results (from the past 240 hour(s))  Resp Panel by RT-PCR (Flu A&B, Covid) Nasopharyngeal Swab     Status: None   Collection Time: 04/28/21  7:13 PM   Specimen: Nasopharyngeal Swab; Nasopharyngeal(NP) swabs in vial transport medium  Result Value Ref Range Status   SARS Coronavirus 2 by RT PCR NEGATIVE NEGATIVE Final    Comment: (NOTE) SARS-CoV-2 target nucleic acids are NOT DETECTED.  The SARS-CoV-2 RNA is generally detectable in upper respiratory specimens during the acute phase of infection. The lowest concentration of SARS-CoV-2 viral copies this assay can detect is 138 copies/mL. A negative result does not preclude SARS-Cov-2 infection and should not be used as the sole basis for treatment or other patient management decisions. A negative result may occur with  improper specimen collection/handling, submission of specimen other than nasopharyngeal swab, presence of viral mutation(s) within the areas targeted by this assay, and inadequate number of viral copies(<138 copies/mL). A negative  result must be combined with clinical observations, patient history, and epidemiological information. The expected result is Negative.  Fact Sheet for Patients:  EntrepreneurPulse.com.au  Fact Sheet for Healthcare Providers:  IncredibleEmployment.be  This test is no t yet approved or cleared by the [place] FDA and  has been authorized for detection and/or diagnosis of SARS-CoV-2 by FDA under an Emergency Use Authorization (EUA). This EUA will remain  in effect (meaning this test can be used) for the duration of the COVID-19 declaration under Section 564(b)(1) of the Act, 21 U.S.C.section 360bbb-3(b)(1), unless the authorization is terminated  or revoked sooner.       Influenza A by PCR NEGATIVE NEGATIVE Final   Influenza B by PCR NEGATIVE NEGATIVE Final     Comment: (NOTE) The Xpert Xpress SARS-CoV-2/FLU/RSV plus assay is intended as an aid in the diagnosis of influenza from Nasopharyngeal swab specimens and should not be used as a sole basis for treatment. Nasal washings and aspirates are unacceptable for Xpert Xpress SARS-CoV-2/FLU/RSV testing.  Fact Sheet for Patients: EntrepreneurPulse.com.au  Fact Sheet for Healthcare Providers: IncredibleEmployment.be  This test is not yet approved or cleared by the [place] FDA and has been authorized for detection and/or diagnosis of SARS-CoV-2 by FDA under an Emergency Use Authorization (EUA). This EUA will remain in effect (meaning this test can be used) for the duration of the COVID-19 declaration under Section 564(b)(1) of the Act, 21 U.S.C. section 360bbb-3(b)(1), unless the authorization is terminated or revoked.  Performed at [facility], [address], [place], [place] [address]     Radiology Studies: CT Angio Chest PE W and/or Wo Contrast  Result Date: 04/28/2021 CLINICAL DATA:  PE suspected, high prob. tolerated w/o complication or reaction Pt arrives to ED with c/o right right leg x1 week. Pt reports he had an outpatient [place] that showed an acute DVT in the right leg. EXAM: CT ANGIOGRAPHY CHEST WITH CONTRAST TECHNIQUE: Multidetector CT imaging of the chest was performed using the standard protocol during bolus administration of intravenous contrast. Multiplanar CT image reconstructions and MIPs were obtained to evaluate the vascular anatomy. CONTRAST:  61mL OMNIPAQUE IOHEXOL 350 MG/ML SOLN COMPARISON:  cxr12/2/21. FINDINGS: Cardiovascular: Satisfactory opacification of the pulmonary arteries to the segmental level. Segmental and subsegmental pulmonary emboli of all 5 lobes. Enlarged right to left ventricular ratio of 1.1. Straightening of the interventricular septum. Otherwise normal heart size. No pericardial effusion. Mild  atherosclerotic plaque. Four-vessel coronary calcifications. Mediastinum/Nodes: No enlarged mediastinal, hilar, or axillary lymph nodes. Thyroid gland, trachea, and esophagus demonstrate no significant findings. Lungs/Pleura: There is a 1 x 0.7 cm subpleural nodule within the right upper lobe. There is a 1.2 x 0.9 cm subpleural right lower lobe pulmonary (6:104). No focal consolidation. No pulmonary nodule. No pulmonary mass. No pleural effusion. No pneumothorax. Upper Abdomen: No acute abnormality. Musculoskeletal: No chest wall abnormality. No suspicious lytic or blastic osseous lesions. No acute displaced fracture. Multilevel degenerative changes of the spine. Review of the MIP images confirms the above findings. IMPRESSION: 1. Segmental and subsegmental pulmonary emboli of all 5 lobes. Associated developing right heart strain. No pulmonary infarction. Positive for acute PE with CT evidence of right heart strain (RV/LV Ratio = 1.1) consistent with at least submassive (intermediate risk) PE. The presence of right heart strain has been associated with an increased risk of morbidity and mortality. Please refer to the "PE Focused" order set in EPIC. 2. Couple of subcentimeter subpleural nodule within the right lung. Non-contrast chest CT  at 3-6 months is recommended. If the nodules are stable at time of repeat CT, then future CT at 18-24 months (from today's scan) is considered optional for low-risk patients, but is recommended for high-risk patients. This recommendation follows the consensus statement: Guidelines for Management of Incidental Pulmonary Nodules Detected on CT Images: From the Fleischner Society 2017; Radiology 2017; 284:228-243. 3.  Aortic Atherosclerosis (ICD10-I70.0). These results were called by telephone at the time of interpretation on 04/28/2021 at 7:00 pm to provider [facility] , who verbally acknowledged these results. Electronically Signed   By: Iven Finn M.D.   On: 04/28/2021 19:05    US Venous Img Lower Unilateral Right (DVT)  Result Date: 04/28/2021 CLINICAL DATA:  Edema, pain EXAM: RIGHT LOWER EXTREMITY VENOUS DOPPLER ULTRASOUND TECHNIQUE: Gray-scale sonography with compression, as well as color and duplex ultrasound, were performed to evaluate the deep venous system(s) from the level of the common femoral vein through the popliteal and proximal calf veins. COMPARISON:  None. FINDINGS: VENOUS Hypoechoic heterogenous intraluminal thrombus in the common femoral vein. The saphenofemoral junction remains patent. There is hypoechoic occlusive thrombus in the deep femoral vein. Hypoechoic non compressible thrombus through the femoral and popliteal veins with limited flow signal on color Doppler. Occlusive thrombus in posterior tibial and peroneal veins in the calf. Antegrade color flow signal noted in the visualized portion of the right external iliac vein. Limited views of the contralateral common femoral vein are unremarkable. OTHER None. Limitations: none IMPRESSION: 1. POSITIVE for right lower extremity acute DVT extending from posterior tibial and peroneal calf veins through the popliteal and femoral veins as above. Electronically Signed   By: Lucrezia Europe M.D.   On: 04/28/2021 13:53   ECHOCARDIOGRAM COMPLETE  Result Date: 04/29/2021    ECHOCARDIOGRAM REPORT   Patient Name:   MANVEER GOMES Date of Exam: 04/29/2021 Medical Rec #:  [id number]       Height:       69.0 in Accession #:    [id number]      Weight:       247.6 lb Date of Birth:  1951-06-04       BSA:          2.262 m Patient Age:    51 years        BP:           120/77 mmHg Patient Gender: M               HR:           83 bpm. Exam Location:  Inpatient Procedure: 2D Echo, Cardiac Doppler and Color Doppler Indications:    Pulmonary Embolus  History:        Patient has prior history of Echocardiogram examinations, most                 recent 09/26/2007. Risk Factors:Hypertension and Dyslipidemia.                 DVT.   Sonographer:    Wenda Low Referring Phys: [id number] Vander  1. Left ventricular ejection fraction, by estimation, is 60 to 65%. The left ventricle has normal function. The left ventricle has no regional wall motion abnormalities. There is severe concentric left ventricular hypertrophy. Left ventricular diastolic  parameters were normal.  2. Right ventricular systolic function is normal. The right ventricular size is normal.  3. Right atrial size was mildly dilated.  4. The mitral valve is normal in structure. No evidence  of mitral valve regurgitation. No evidence of mitral stenosis.  5. The aortic valve is calcified. Aortic valve regurgitation is not visualized. Mild to moderate aortic valve sclerosis/calcification is present, without any evidence of aortic stenosis. Aortic valve area, by VTI measures 1.99 cm. Aortic valve mean gradient measures 6.0 mmHg. Aortic valve Vmax measures 1.72 m/s.  6. Aortic dilatation noted. There is borderline dilatation of the aortic root, measuring 37 mm. There is mild dilatation of the ascending aorta, measuring 39 mm. FINDINGS  Left Ventricle: Left ventricular ejection fraction, by estimation, is 60 to 65%. The left ventricle has normal function. The left ventricle has no regional wall motion abnormalities. The left ventricular internal cavity size was normal in size. There is  severe concentric left ventricular hypertrophy. Left ventricular diastolic parameters were normal. Normal left ventricular filling pressure. Right Ventricle: The right ventricular size is normal. No increase in right ventricular wall thickness. Right ventricular systolic function is normal. Left Atrium: Left atrial size was normal in size. Right Atrium: Right atrial size was mildly dilated. Pericardium: There is no evidence of pericardial effusion. Mitral Valve: The mitral valve is normal in structure. No evidence of mitral valve regurgitation. No evidence of mitral valve  stenosis. MV peak gradient, 2.8 mmHg. The mean mitral valve gradient is 1.0 mmHg. Tricuspid Valve: The tricuspid valve is normal in structure. Tricuspid valve regurgitation is trivial. No evidence of tricuspid stenosis. Aortic Valve: The aortic valve is calcified. Aortic valve regurgitation is not visualized. Mild to moderate aortic valve sclerosis/calcification is present, without any evidence of aortic stenosis. Aortic valve mean gradient measures 6.0 mmHg. Aortic valve peak gradient measures 11.8 mmHg. Aortic valve area, by VTI measures 1.99 cm. Pulmonic Valve: The pulmonic valve was normal in structure. Pulmonic valve regurgitation is trivial. No evidence of pulmonic stenosis. Aorta: Aortic dilatation noted. There is borderline dilatation of the aortic root, measuring 37 mm. There is mild dilatation of the ascending aorta, measuring 39 mm. Venous: The inferior vena cava was not well visualized. IAS/Shunts: No atrial level shunt detected by color flow Doppler.  LEFT VENTRICLE PLAX 2D LVIDd:         3.70 cm     Diastology LVIDs:         2.50 cm     LV e' medial:    8.05 cm/s LV PW:         1.60 cm     LV E/e' medial:  10.5 LV IVS:        1.70 cm     LV e' lateral:   7.51 cm/s LVOT diam:     2.20 cm     LV E/e' lateral: 11.2 LV SV:         68 LV SV Index:   30 LVOT Area:     3.80 cm  LV Volumes (MOD) LV vol d, MOD A2C: 71.5 ml LV vol d, MOD A4C: 76.9 ml LV vol s, MOD A2C: 32.1 ml LV vol s, MOD A4C: 33.8 ml LV SV MOD A2C:     39.4 ml LV SV MOD A4C:     76.9 ml LV SV MOD BP:      43.1 ml RIGHT VENTRICLE RV Basal diam:  3.60 cm RV Mid diam:    2.80 cm TAPSE (M-mode): 3.0 cm LEFT ATRIUM             Index        RIGHT ATRIUM  Index LA diam:        4.10 cm 1.81 cm/m   RA Area:     24.10 cm LA Vol (A2C):   53.5 ml 23.65 ml/m  RA Volume:   77.90 ml  34.43 ml/m LA Vol (A4C):   57.2 ml 25.28 ml/m LA Biplane Vol: 56.9 ml 25.15 ml/m  AORTIC VALVE                     PULMONIC VALVE AV Area (Vmax):    2.21 cm       PV Vmax:       0.76 m/s AV Area (Vmean):   2.08 cm      PV Peak grad:  2.3 mmHg AV Area (VTI):     1.99 cm AV Vmax:           172.00 cm/s AV Vmean:          115.000 cm/s AV VTI:            0.342 m AV Peak Grad:      11.8 mmHg AV Mean Grad:      6.0 mmHg LVOT Vmax:         99.90 cm/s LVOT Vmean:        63.000 cm/s LVOT VTI:          0.179 m LVOT/AV VTI ratio: 0.52  AORTA Ao Root diam: 3.70 cm Ao Asc diam:  3.90 cm MITRAL VALVE               TRICUSPID VALVE MV Area (PHT): 3.51 cm    TR Peak grad:   10.1 mmHg MV Area VTI:   2.55 cm    TR Vmax:        159.00 cm/s MV Peak grad:  2.8 mmHg MV Mean grad:  1.0 mmHg    SHUNTS MV Vmax:       0.83 m/s    Systemic VTI:  0.18 m MV Vmean:      57.4 cm/s   Systemic Diam: 2.20 cm MV Decel Time: 216 msec MV E velocity: 84.40 cm/s MV A velocity: 83.60 cm/s MV E/A ratio:  1.01 Fransico Him MD Electronically signed by Fransico Him MD Signature Date/Time: 04/29/2021/12:18:58 PM    Final        Dameisha Tschida T. Frankford  If 7PM-7AM, please contact night-coverage [email or web address] 04/29/2021, 1:16 PM

## 2021-04-29 NOTE — Progress Notes (Signed)
*  PRELIMINARY RESULTS* Echocardiogram 2D Echocardiogram has been performed.  Eric Campbell 04/29/2021, 10:34 AM

## 2021-04-29 NOTE — Progress Notes (Signed)
Pt has home cpap. PT stated that he will place himself on home cpap machine.

## 2021-04-29 NOTE — Progress Notes (Signed)
ANTICOAGULATION CONSULT NOTE  Pharmacy Consult for heparin Indication: pulmonary embolus  Allergies  Allergen Reactions   Lotrisone [Clotrimazole-Betamethasone] Rash   Penicillins Hives and Swelling    Patient Measurements: Height: 5\' 9"  (175.3 cm) Weight: 112.3 kg (247 lb 9.6 oz) IBW/kg (Calculated) : 70.7 Heparin Dosing Weight: 93.5 kg  Vital Signs: Temp: 98 F (36.7 C) (10/29 0929) Temp Source: Oral (10/29 0929) BP: 142/86 (10/29 0929) Pulse Rate: 84 (10/29 0929)  Labs: Recent Labs    04/28/21 1719 04/28/21 1903 04/28/21 2119 04/29/21 0314 04/29/21 1315  HGB 14.4  --   --  13.8  --   HCT 40.8  --   --  38.1*  --   PLT 218  --   --  222  --   HEPARINUNFRC  --   --   --  0.28* 0.45  CREATININE 0.97  --   --   --   --   TROPONINIHS  --  10 7  --   --      Estimated Creatinine Clearance: 85 mL/min (by C-G formula based on SCr of 0.97 mg/dL).  Assessment: 70 yo M with acute PE w/ RHS. Heparin for AC. CBC wnl. No AC PTA.  Heparin level therapeutic (0.45) on gtt at 1800 units/hr. No issues with line or bleeding reported per RN.  Goal of Therapy:  Heparin level 0.3-0.7 units/ml Monitor platelets by anticoagulation protocol: Yes   Plan:  Continue heparin infusion at 1800 units/hr Daily heparin level and CBC  Thank you for allowing pharmacy to participate in this patient's care.  Reatha Harps, PharmD PGY1 Pharmacy Resident 04/29/2021 2:49 PM Check [email or web address] for unit specific pharmacy number

## 2021-04-29 NOTE — Plan of Care (Signed)

## 2021-04-29 NOTE — H&P (Signed)
History and Physical    Eric Campbell XIP:[id number] DOB: 1951-02-18 DOA: 04/28/2021  PCP: Antony Contras, MD Patient coming from: [facility]  Chief Complaint: Right leg swelling  HPI: Eric Campbell is a 70 y.o. male with medical history significant of moderate persistent asthma, OSA on CPAP, allergic rhinitis, BPH, non-insulin-dependent type II diabetes, hypertension, hyperlipidemia, obesity (BMI 36.56) presented to the ED complaining of right leg swelling.  Outpatient ultrasound done by PCP was positive for right lower extremity acute DVT extending from the posterior tibial and peroneal veins through the popliteal and femoral veins..  He was sent to the ED for further evaluation.  Not tachycardic or hypoxic.  Not hypotensive.  Labs showing WBC 7.5, hemoglobin 14.4, platelet count 218k.  Sodium 140, potassium 3.9, chloride 106, bicarb 27, BUN 11, creatinine 0.9, glucose 101.  High-sensitivity troponin negative x2.  COVID and influenza PCR negative.  CT angiogram chest showing segmental and subsegmental pulmonary emboli of all 5 lobes with associated developing right heart strain (RV/LV ratio = 1.1 consistent with at least submassive PE).  No pulmonary infarction.  Also showing pulmonary nodules. Hypercoagulable panel ordered.  Heparin drip ordered.  Patient reports 1 week history of right leg swelling.  He went to his PCP yesterday and had an ultrasound done which revealed blood clots in his right leg.  Reports chronic shortness of breath due to asthma.  Denies chest pain.  Denies history of blood clots in the past or family history of blood clots.  Does report very limited mobility at baseline due to chronic back problems for which he is seen by neurosurgery.  Denies recent surgeries or long distance travel.  No other complaints.  Review of Systems:  All systems reviewed and apart from history of presenting illness, are negative.  Past Medical History:  Diagnosis Date   Allergic rhinitis    Allergy     Asthma    BPH (benign prostatic hyperplasia)    Colon polyps    Congenital hearing loss    Diabetes mellitus    diet controlled   Dyslipidemia    ED (erectile dysfunction)    Hyperlipidemia    Hypertension    Hypogonadism male    Obesity    Osteopenia    Seasonal allergies    Sleep apnea     wears cpap   Torn ligament    left ankle   Vitamin D deficiency     Past Surgical History:  Procedure Laterality Date   ACHILLES TENDON SURGERY Left    ankle   COLONOSCOPY     POLYPECTOMY     UMBILICAL HERNIA REPAIR  1998     reports that he has never smoked. He has never used smokeless tobacco. He reports that he does not currently use alcohol. He reports that he does not use drugs.  Allergies  Allergen Reactions   Penicillins Swelling   Lotrisone [Clotrimazole-Betamethasone] Rash    Family History  Problem Relation Age of Onset   Pancreatic cancer Mother 61   COPD Mother    Emphysema Mother    Other Mother        had Colostomy-    COPD Father    Heart attack Father    Emphysema Father    Ulcerative colitis Grandchild 15   Diabetes Sister    Cancer Brother 40       type unknown   Colon cancer Neg Hx    Stomach cancer Neg Hx    Rectal cancer Neg Hx  Colon polyps Neg Hx    Esophageal cancer Neg Hx     Prior to Admission medications   Medication Sig Start Date End Date Taking? Authorizing Provider  albuterol (PROVENTIL HFA;VENTOLIN HFA) 108 (90 BASE) MCG/ACT inhaler Inhale 2 puffs into the lungs every 4 (four) hours as needed for wheezing or shortness of breath.    [provider]  alclomethasone (ACLOVATE) 0.05 % cream APPLY TO THE AFFECTED AREA(S) UP TO TWICE DAILY. 01/07/20   [provider]  atorvastatin (LIPITOR) 20 MG tablet Take 20 mg by mouth daily.  09/15/11   [provider]  augmented betamethasone dipropionate (DIPROLENE-AF) 0.05 % cream Apply 1 application topically in the morning and at bedtime. 01/07/20   [provider]  B Complex-Folic Acid (B COMPLEX-VITAMIN B12 PO) Take 1 tablet by mouth daily.    [provider]  budesonide-formoterol (SYMBICORT) 80-4.5 MCG/ACT inhaler 2 puffs in the morning right when you wake up, rinse out your mouth after use, 12 hours later 2 puffs, rinse after use.Take this daily, no matter what.This is not a rescue inhaler 06/02/20   Lauraine Rinne, NP  Calcium Carbonate 1500 (600 CA) MG TABS Take 1 tablet by mouth daily.     [provider]  Continuous Blood Gluc Sensor (FREESTYLE LIBRE 14 DAY SENSOR) MISC as directed every 2 weeks    [provider]  Continuous Blood Gluc Sensor (FREESTYLE LIBRE 14 DAY SENSOR) MISC Apply topically every 14 (fourteen) days. 04/12/20   [provider]  losartan (COZAAR) 100 MG tablet Take 100 mg by mouth daily. 04/12/20   [provider]  metFORMIN (GLUCOPHAGE) 1000 MG tablet Take 1,000 mg by mouth 2 (two) times daily. 04/12/20   [provider]  Omega-3 Fatty Acids (FISH OIL) 1000 MG CAPS Take 1 capsule by mouth daily.    [provider]  OneTouch Delica Lancets 40G MISC TEST BLOOD GLUCOSE ONCE DAILY/ DX [id number]    [provider]  [facility] injection  08/01/20   [provider]  Testosterone 20.25 MG/ACT (1.62%) GEL Apply 2 Pump topically daily. 02/19/20   [provider]  Vitamin D, Ergocalciferol, 2000 units CAPS Take 2 capsules by mouth 2 (two) times daily.    [provider]    Physical Exam: Vitals:   04/29/21 0000 04/29/21 0100 04/29/21 0115 04/29/21 0201  BP: (!) 142/119 (!) 152/119 (!) 136/91 (!) 150/86  Pulse: 87 81 83 76  Resp: 12 13 17  (!) 21  Temp:    97.7 F (36.5 C)  TempSrc:    Oral  SpO2: 97% 95% 99% 99%  Weight:    112.3 kg  Height:    5\' 9"  (1.753 m)    Physical Exam Constitutional:      General: He is not in acute distress. HENT:     Head: Normocephalic and atraumatic.  Eyes:     Extraocular Movements:  Extraocular movements intact.     Conjunctiva/sclera: Conjunctivae normal.  Cardiovascular:     Rate and Rhythm: Normal rate and regular rhythm.     Pulses: Normal pulses.  Pulmonary:     Effort: Pulmonary effort is normal. No respiratory distress.     Breath sounds: Normal breath sounds. No wheezing or rales.  Abdominal:     General: Bowel sounds are normal. There is no distension.     Palpations: Abdomen is soft.     Tenderness: There is no abdominal tenderness.  Musculoskeletal:     Cervical  back: Normal range of motion and neck supple.     Right lower leg: Edema present.     Comments: Right lower extremity swollen and warm to touch  Skin:    General: Skin is warm and dry.  Neurological:     General: No focal deficit present.     Mental Status: He is alert and oriented to person, place, and time.     Labs on Admission: I have personally reviewed following labs and imaging studies  CBC: Recent Labs  Lab 04/28/21 1719  WBC 7.5  NEUTROABS 4.1  HGB 14.4  HCT 40.8  MCV 88.7  PLT 761   Basic Metabolic Panel: Recent Labs  Lab 04/28/21 1719  NA 140  K 3.9  CL 106  CO2 27  GLUCOSE 101*  BUN 11  CREATININE 0.97  CALCIUM 9.5   GFR: Estimated Creatinine Clearance: 85 mL/min (by C-G formula based on SCr of 0.97 mg/dL). Liver Function Tests: No results for input(s): AST, ALT, ALKPHOS, BILITOT, PROT, ALBUMIN in the last 168 hours. No results for input(s): LIPASE, AMYLASE in the last 168 hours. No results for input(s): AMMONIA in the last 168 hours. Coagulation Profile: No results for input(s): INR, PROTIME in the last 168 hours. Cardiac Enzymes: No results for input(s): CKTOTAL, CKMB, CKMBINDEX, TROPONINI in the last 168 hours. BNP (last 3 results) No results for input(s): PROBNP in the last 8760 hours. HbA1C: No results for input(s): HGBA1C in the last 72 hours. CBG: No results for input(s): GLUCAP in the last 168 hours. Lipid Profile: No results for input(s):  CHOL, HDL, LDLCALC, TRIG, CHOLHDL, LDLDIRECT in the last 72 hours. Thyroid Function Tests: No results for input(s): TSH, T4TOTAL, FREET4, T3FREE, THYROIDAB in the last 72 hours. Anemia Panel: No results for input(s): VITAMINB12, FOLATE, FERRITIN, TIBC, IRON, RETICCTPCT in the last 72 hours. Urine analysis: No results found for: COLORURINE, APPEARANCEUR, LABSPEC, PHURINE, GLUCOSEU, HGBUR, BILIRUBINUR, KETONESUR, PROTEINUR, UROBILINOGEN, NITRITE, LEUKOCYTESUR  Radiological Exams on Admission: CT Angio Chest PE W and/or Wo Contrast  Result Date: 04/28/2021 CLINICAL DATA:  PE suspected, high prob. tolerated w/o complication or reaction Pt arrives to ED with c/o right right leg x1 week. Pt reports he had an outpatient [place] that showed an acute DVT in the right leg. EXAM: CT ANGIOGRAPHY CHEST WITH CONTRAST TECHNIQUE: Multidetector CT imaging of the chest was performed using the standard protocol during bolus administration of intravenous contrast. Multiplanar CT image reconstructions and MIPs were obtained to evaluate the vascular anatomy. CONTRAST:  98mL OMNIPAQUE IOHEXOL 350 MG/ML SOLN COMPARISON:  cxr12/2/21. FINDINGS: Cardiovascular: Satisfactory opacification of the pulmonary arteries to the segmental level. Segmental and subsegmental pulmonary emboli of all 5 lobes. Enlarged right to left ventricular ratio of 1.1. Straightening of the interventricular septum. Otherwise normal heart size. No pericardial effusion. Mild atherosclerotic plaque. Four-vessel coronary calcifications. Mediastinum/Nodes: No enlarged mediastinal, hilar, or axillary lymph nodes. Thyroid gland, trachea, and esophagus demonstrate no significant findings. Lungs/Pleura: There is a 1 x 0.7 cm subpleural nodule within the right upper lobe. There is a 1.2 x 0.9 cm subpleural right lower lobe pulmonary (6:104). No focal consolidation. No pulmonary nodule. No pulmonary mass. No pleural effusion. No pneumothorax. Upper Abdomen: No acute  abnormality. Musculoskeletal: No chest wall abnormality. No suspicious lytic or blastic osseous lesions. No acute displaced fracture. Multilevel degenerative changes of the spine. Review of the MIP images confirms the above findings. IMPRESSION: 1. Segmental and subsegmental pulmonary emboli of all 5 lobes. Associated developing right heart strain.  No pulmonary infarction. Positive for acute PE with CT evidence of right heart strain (RV/LV Ratio = 1.1) consistent with at least submassive (intermediate risk) PE. The presence of right heart strain has been associated with an increased risk of morbidity and mortality. Please refer to the "PE Focused" order set in EPIC. 2. Couple of subcentimeter subpleural nodule within the right lung. Non-contrast chest CT at 3-6 months is recommended. If the nodules are stable at time of repeat CT, then future CT at 18-24 months (from today's scan) is considered optional for low-risk patients, but is recommended for high-risk patients. This recommendation follows the consensus statement: Guidelines for Management of Incidental Pulmonary Nodules Detected on CT Images: From the Fleischner Society 2017; Radiology 2017; 284:228-243. 3.  Aortic Atherosclerosis (ICD10-I70.0). These results were called by telephone at the time of interpretation on 04/28/2021 at 7:00 pm to provider [facility] , who verbally acknowledged these results. Electronically Signed   By: Iven Finn M.D.   On: 04/28/2021 19:05   US Venous Img Lower Unilateral Right (DVT)  Result Date: 04/28/2021 CLINICAL DATA:  Edema, pain EXAM: RIGHT LOWER EXTREMITY VENOUS DOPPLER ULTRASOUND TECHNIQUE: Gray-scale sonography with compression, as well as color and duplex ultrasound, were performed to evaluate the deep venous system(s) from the level of the common femoral vein through the popliteal and proximal calf veins. COMPARISON:  None. FINDINGS: VENOUS Hypoechoic heterogenous intraluminal thrombus in the common  femoral vein. The saphenofemoral junction remains patent. There is hypoechoic occlusive thrombus in the deep femoral vein. Hypoechoic non compressible thrombus through the femoral and popliteal veins with limited flow signal on color Doppler. Occlusive thrombus in posterior tibial and peroneal veins in the calf. Antegrade color flow signal noted in the visualized portion of the right external iliac vein. Limited views of the contralateral common femoral vein are unremarkable. OTHER None. Limitations: none IMPRESSION: 1. POSITIVE for right lower extremity acute DVT extending from posterior tibial and peroneal calf veins through the popliteal and femoral veins as above. Electronically Signed   By: Lucrezia Europe M.D.   On: 04/28/2021 13:53    EKG: Independently reviewed.  Sinus rhythm, borderline PR prolongation.  No acute ischemic changes.  Assessment/Plan Principal Problem:   Acute pulmonary embolism (HCC) Active Problems:   OSA (obstructive sleep apnea)   Asthma   DVT (deep venous thrombosis) (HCC)   HTN (hypertension)   Acute pulmonary embolism and right lower extremity DVT Likely precipitated by immobility.  No recent surgeries or long distance travel.  Patient is hemodynamically stable.  Not hypoxic, tachycardic, or hypotensive.  High-sensitivity troponin negative x2.  CT angiogram chest showing segmental and subsegmental pulmonary emboli of all 5 lobes with associated developing right heart strain (RV/LV ratio = 1.1 consistent with at least submassive PE).  No pulmonary infarction.  Venous ultrasound of right lower extremity showing right lower extremity acute DVT extending from the posterior tibial and peroneal veins through the popliteal and femoral veins. -Continue IV heparin.  Check BNP level.  Echocardiogram ordered.  Continuous pulse ox, supplemental oxygen as needed to keep oxygen saturation above 92%.  Hypercoagulable panel ordered.  Moderate persistent asthma Stable.  Not  wheezing. -Albuterol as needed.  Resume home maintenance inhaler after pharmacy med rec is done.  OSA -Continue nightly CPAP  Non-insulin-dependent type 2 diabetes -Check A1c.  Sliding scale insulin sensitive ACHS.  Incidental pulmonary nodules CT showing couple of subcentimeter subpleural nodules within the right lung. -Radiologist recommending noncontrast chest CT in 3 to 6 months  for further evaluation.  Hypertension Hyperlipidemia -Pharmacy med rec pending.  DVT prophylaxis: Heparin Code Status: Full code-discussed with the patient. Family Communication: Diagnostic findings and treatment plan discussed with the patient.  No family available at this time. Disposition Plan: Status is: Inpatient  Remains inpatient appropriate because: Submassive PE requiring IV heparin  Level of care:  Level of care: Progressive  The medical decision making on this patient was of high complexity and the patient is at high risk for clinical deterioration, therefore this is a level 3 visit.  Shela Leff MD [facility]  If 7PM-7AM, please contact night-coverage [email or web address]  04/29/2021, 3:16 AM

## 2021-04-29 NOTE — Progress Notes (Signed)
ANTICOAGULATION CONSULT NOTE  Pharmacy Consult for heparin Indication: pulmonary embolus  Allergies  Allergen Reactions   Penicillins Swelling   Lotrisone [Clotrimazole-Betamethasone] Rash    Patient Measurements: Height: 5\' 9"  (175.3 cm) Weight: 112.3 kg (247 lb 9.6 oz) IBW/kg (Calculated) : 70.7 Heparin Dosing Weight: 93.5 kg  Vital Signs: Temp: 97.8 F (36.6 C) (10/29 0342) Temp Source: Oral (10/29 0342) BP: 120/77 (10/29 0342) Pulse Rate: 83 (10/29 0342)  Labs: Recent Labs    04/28/21 1719 04/28/21 1903 04/28/21 2119 04/29/21 0314  HGB 14.4  --   --  13.8  HCT 40.8  --   --  38.1*  PLT 218  --   --  222  HEPARINUNFRC  --   --   --  0.28*  CREATININE 0.97  --   --   --   TROPONINIHS  --  10 7  --      Estimated Creatinine Clearance: 85 mL/min (by C-G formula based on SCr of 0.97 mg/dL).  Assessment: 70 yo M with acute PE w/ RHS. Heparin for AC. CBC wnl. No AC PTA.  Heparin level slightly subtherapeutic (0.28) on gtt at 1600 units/hr. No issues with line or bleeding reported per RN.  Goal of Therapy:  Heparin level 0.3-0.7 units/ml Monitor platelets by anticoagulation protocol: Yes   Plan:  Rebolus heparin 1500 units Increase heparin infusion to 1800 units/hr F/u 8hr heparin level  Sherlon Handing, PharmD, BCPS Please see amion for complete clinical pharmacist phone list 04/29/2021 4:35 AM

## 2021-04-30 ENCOUNTER — Encounter (HOSPITAL_COMMUNITY): Payer: Self-pay | Admitting: Pharmacist

## 2021-04-30 DIAGNOSIS — E291 Testicular hypofunction: Secondary | ICD-10-CM

## 2021-04-30 DIAGNOSIS — I2699 Other pulmonary embolism without acute cor pulmonale: Secondary | ICD-10-CM | POA: Diagnosis not present

## 2021-04-30 DIAGNOSIS — R918 Other nonspecific abnormal finding of lung field: Secondary | ICD-10-CM | POA: Diagnosis present

## 2021-04-30 DIAGNOSIS — J4540 Moderate persistent asthma, uncomplicated: Secondary | ICD-10-CM

## 2021-04-30 DIAGNOSIS — E1165 Type 2 diabetes mellitus with hyperglycemia: Secondary | ICD-10-CM | POA: Diagnosis not present

## 2021-04-30 DIAGNOSIS — I824Y1 Acute embolism and thrombosis of unspecified deep veins of right proximal lower extremity: Secondary | ICD-10-CM | POA: Diagnosis not present

## 2021-04-30 LAB — BASIC METABOLIC PANEL
Anion gap: 6 (ref 5–15)
BUN: 7 mg/dL — ABNORMAL LOW (ref 8–23)
CO2: 25 mmol/L (ref 22–32)
Calcium: 8.8 mg/dL — ABNORMAL LOW (ref 8.9–10.3)
Chloride: 107 mmol/L (ref 98–111)
Creatinine, Ser: 0.98 mg/dL (ref 0.61–1.24)
GFR, Estimated: >60 mL/min (ref >60)
Glucose, Bld: 130 mg/dL — ABNORMAL HIGH (ref 70–99)
Potassium: 3.8 mmol/L (ref 3.5–5.1)
Sodium: 138 mmol/L (ref 135–145)

## 2021-04-30 LAB — CBC
HCT: 37.5 % — ABNORMAL LOW (ref 39.0–52.0)
Hemoglobin: 13.2 g/dL (ref 13.0–17.0)
MCH: 31.1 pg (ref 26.0–34.0)
MCHC: 35.2 g/dL (ref 30.0–36.0)
MCV: 88.4 fL (ref 80.0–100.0)
Platelets: 221 10*3/uL (ref 150–400)
RBC: 4.24 MIL/uL (ref 4.22–5.81)
RDW: 14.6 % (ref 11.5–15.5)
WBC: 7.3 10*3/uL (ref 4.0–10.5)
nRBC: 0 % (ref 0.0–0.2)

## 2021-04-30 LAB — GLUCOSE, CAPILLARY
Glucose-Capillary: 136 mg/dL — ABNORMAL HIGH (ref 70–99)
Glucose-Capillary: 155 mg/dL — ABNORMAL HIGH (ref 70–99)

## 2021-04-30 LAB — HEPARIN LEVEL (UNFRACTIONATED): Heparin Unfractionated: 0.59 IU/mL (ref 0.30–0.70)

## 2021-04-30 MED ORDER — APIXABAN (ELIQUIS) VTE STARTER PACK (10MG AND 5MG): ORAL_TABLET | ORAL | 0 refills | Status: DC

## 2021-04-30 MED ORDER — APIXABAN 5 MG PO TABS: 5.0000 mg | ORAL_TABLET | Freq: Two times a day (BID) | ORAL | 1 refills | Status: DC

## 2021-04-30 MED ORDER — APIXABAN 5 MG PO TABS
10.0000 mg | ORAL_TABLET | Freq: Two times a day (BID) | ORAL | Status: DC
Start: 2021-04-30 — End: 2021-04-30
  Administered 2021-04-30: 10 mg via ORAL
  Filled 2021-04-30: qty 2

## 2021-04-30 MED ORDER — APIXABAN 5 MG PO TABS: 5.0000 mg | ORAL_TABLET | Freq: Two times a day (BID) | ORAL | Status: DC

## 2021-04-30 NOTE — Discharge Summary (Signed)
Physician Discharge Summary  Regan Llorente Rabadi AVW:[id number] DOB: 1951/04/29 DOA: 04/28/2021  PCP: Antony Contras, MD  Admit date: 04/28/2021 Discharge date: 04/30/2021 Admitted From: Home Disposition: Home Recommendations for Outpatient Follow-up:  Follow ups as below. Please obtain CBC/BMP/Mag at follow up Please reassess need for testosterone treatment given his VTE. Please ensure age-appropriate cancer screenings Needs follow-up CT chest without contrast in 3 to 6 months for pulmonary nodules. Please discuss about outpatient physical therapy with patient at follow-up Please follow up on the following pending results: Hypercoagulable labs  Home Health: Therapy recommended outpatient PT as needed Equipment/Devices: None Discharge Condition: Stable CODE STATUS: Full code  Follow-up Information     Antony Contras, MD. Schedule an appointment as soon as possible for a visit.   Specialty: Family Medicine Contact information: [address], [place] [address] [phone]                Hospital Course: 70 year old M with moderate persistent asthma, OSA on CPAP, NIDDM-2, HTN, HLD, morbid obesity, BPH, lower back pain with radiculopathy, left lower extremity weakness, hypogonadism and allergic rhinitis presented to PCP with RLE swelling for 1 week and found to have DVT, and sent to ED for further evaluation.  Patient has history of hypogonadism, and uses testosterone gel daily.  He has sedentary lifestyle partly due to back pain and radiculopathy.  No recent travel, prior history of blood clot or malignancy.  Reportedly had colonoscopy about a year and half ago.  He states that 3 precancerous polyps resected at that time.  In ED, CTA chest with segmental and subsegmental PE in all 5 lobes with RV to LV ratio of 1.1.  CTA also noted 1.2 x 0.9 cm subpleural RLL and 1 x 0.7 cm subpleural RUL nodules.  Patient was started on IV heparin.  TTE with LVEF of 60 to 65%  and no RV strain.  On the day of discharge, patient remained stable.  Ambulated on room air and maintain appropriate saturation.  He was transitioned to p.o. Eliquis and discharged on starter pack Eliquis.  Encouraged to discuss about his testosterone treatment with his prescriber/PCP.  Ensure that patient has appropriate cancer screening.  Also needs follow-up CT chest without contrast for pulmonary nodules in about 3 to 6 months.  See individual problem list below for more on hospital course.  Discharge Diagnoses:  Acute pulmonary embolism with radiologic evidence of right heart strain/RLE DVT-risk factors include testosterone therapy and sedentary lifestyle.  He also reports 3 precancerous colon polyps resection about a year and half ago.  CTA chest also shows two pulmonary nodules. TTE with LVEF of 60 to 65% and no RV strain. -Transitioned to p.o. Eliquis-starter pack -Follow hypercoagulable labs. -Check CBC at follow-up -Reassess need for testosterone treatment given his VTE. -Ensure age-appropriate cancer screenings -Follow-up CT chest without contrast in 3 to 6 months for pulmonary nodules.   Moderate persistent asthma: At baseline. -Continue home breathing treatments   Uncontrolled NIDDM-2 with hyperglycemia and hyperlipidemia: A1c 7.1%.  On metformin at home. -Continue home metformin and statin.   Incidental pulmonary nodule: A 1.2 x 0.9 cm subpleural RLL and 1 x 0.7 cm subpleural RUL nodules noted -Noncontrast CT in 3 to 6 months is recommended.   Essential hypertension: BP slightly elevated. -Continue home losartan.   Hyperlipidemia -Continue Lipitor as above  Hypogonadism -On daily testosterone gel.  Could potentially increase the risk of VTE.  Encouraged to discuss with PCP   Chronic  back pain/lumbar stenosis with radiculopathy/LLE weakness -Followed by neurosurgery as previously planned -Consider ambulatory referral for physical therapy  OSA on CPAP   Morbid  obesity Body mass index is 36.17 kg/m.  -Consider GLP-1 agonist -Lifestyle change including diet and exercise         Discharge Exam: Vitals:   04/30/21 0319 04/30/21 0759 04/30/21 0855 04/30/21 1102  BP: 135/82 138/82  122/87  Pulse: 77 91  80  Temp: (!) 97.5 F (36.4 C) 98 F (36.7 C)  98.1 F (36.7 C)  Resp: 18 18  20   Height:      Weight: 111.1 kg     SpO2: 97% 95% 94% 96%  TempSrc: Oral Oral  Oral  BMI (Calculated): 36.15        GENERAL: No apparent distress.  Nontoxic. HEENT: MMM.  Vision and hearing grossly intact.  NECK: Supple.  No apparent JVD but difficult exam.  RESP: 96% on RA.  No IWOB.  Fair aeration bilaterally. CVS:  RRR. Heart sounds normal.  ABD/GI/GU: Bowel sounds present. Soft. Non tender.  MSK/EXT:  Moves extremities. No apparent deformity.  RLE edema/swelling. SKIN: no apparent skin lesion or wound NEURO: Awake and alert.  Oriented appropriately.  No apparent focal neuro deficit. PSYCH: Calm. Normal affect.   Discharge Instructions  Discharge Instructions     Diet - low sodium heart healthy   Complete by: As directed    Diet Carb Modified   Complete by: As directed    Discharge instructions   Complete by: As directed    It has been a pleasure taking care of you!  You were hospitalized with blood clot in your right leg and your lungs.  It is not quite clear why you had a blood clot but your risk factors include testosterone treatment and not moving a lot. You have been started on blood thinner.  Please make sure you take your medications as prescribed.  Avoid any over-the-counter pain medication other than plain Tylenol.  Please watch for signs of bleeding such as blood in stool or dark tarry stool.  Follow-up with your primary care doctor in 1 to 2 weeks or sooner if needed.  Please discuss about your testosterone treatment with your prescribers.  The CT of lung also showed 2 small lung nodules in the right lung. These might be benign but  needs follow-up CT in 3 to 6 months just to make sure there is no concerning changes.  Venous thromboembolism (blood clots) - Standard labeling of testosterone products in the [place] has included information about the risk of venous thromboembolism (VTE) as a consequence of erythrocytosis (see 'Erythrocytosis' above). However, after reports of deep venous thromboses and pulmonary emboli unrelated to polycythemia in men taking testosterone [70], the [place] Food and Drug Administration (FDA) now requires a more general warning in the labeling of all approved testosterone products [71]. In one study, 39 of 31 cases of VTE in men taking testosterone were associated with a previously undiagnosed thrombophilia-hypofibrinolysis, highlighting the importance of a careful personal and family VTE history prior to initiating treatment. Routine screening for thrombophilias in men considering testosterone therapy is not currently suggested     Take care,   Increase activity slowly   Complete by: As directed       Allergies as of 04/30/2021       Reactions   Lotrisone [clotrimazole-betamethasone] Rash   Penicillins Hives, Swelling        Medication List  TAKE these medications    albuterol 108 (90 Base) MCG/ACT inhaler Commonly known as: VENTOLIN HFA Inhale 2 puffs into the lungs every 4 (four) hours as needed for wheezing or shortness of breath.   alclomethasone 0.05 % cream Commonly known as: ACLOVATE Apply 1 application topically 2 (two) times daily.   Apixaban Starter Pack (10mg  and 5mg ) Commonly known as: ELIQUIS STARTER PACK Take as directed on package: start with two-5mg  tablets twice daily for 7 days. On day 8, switch to one-5mg  tablet twice daily.   apixaban 5 MG Tabs tablet Commonly known as: ELIQUIS Take 1 tablet (5 mg total) by mouth 2 (two) times daily. Start this once you finish starter pack Start taking on: May 30, 2021   atorvastatin 20 MG tablet Commonly known  as: LIPITOR Take 20 mg by mouth daily.   augmented betamethasone dipropionate 0.05 % cream Commonly known as: DIPROLENE-AF Apply 1 application topically in the morning and at bedtime.   B COMPLEX-VITAMIN B12 PO Take 2-3 tablets by mouth daily.   budesonide-formoterol 80-4.5 MCG/ACT inhaler Commonly known as: Symbicort 2 puffs in the morning right when you wake up, rinse out your mouth after use, 12 hours later 2 puffs, rinse after use.Take this daily, no matter what.This is not a rescue inhaler   calcium carbonate 1500 (600 Ca) MG Tabs tablet Commonly known as: OSCAL Take 1 tablet by mouth daily.   Fish Oil 1000 MG Caps Take 1 capsule by mouth daily.   FreeStyle Libre 14 Day Sensor Misc as directed every 2 weeks   FreeStyle Libre 14 Day Sensor Misc Apply topically every 14 (fourteen) days.   losartan 100 MG tablet Commonly known as: COZAAR Take 100 mg by mouth daily.   metFORMIN 1000 MG tablet Commonly known as: GLUCOPHAGE Take 1,000 mg by mouth 2 (two) times daily.   OneTouch Delica Lancets 02R Misc TEST BLOOD GLUCOSE ONCE DAILY/ DX E11.65   Shingrix injection Generic drug: Zoster Vaccine Adjuvanted   Testosterone 20.25 MG/ACT (1.62%) Gel Apply 2 Pump topically daily.   triamcinolone cream 0.1 % Commonly known as: KENALOG Apply 1 application topically daily as needed for rash.   Vitamin D (Ergocalciferol) 50 MCG (2000 UT) Caps Take 2 capsules by mouth 2 (two) times daily.        Consultations: None  Procedures/Studies: None   CT Angio Chest PE W and/or Wo Contrast  Result Date: 04/28/2021 CLINICAL DATA:  PE suspected, high prob. tolerated w/o complication or reaction Pt arrives to ED with c/o right right leg x1 week. Pt reports he had an outpatient [place] that showed an acute DVT in the right leg. EXAM: CT ANGIOGRAPHY CHEST WITH CONTRAST TECHNIQUE: Multidetector CT imaging of the chest was performed using the standard protocol during bolus administration  of intravenous contrast. Multiplanar CT image reconstructions and MIPs were obtained to evaluate the vascular anatomy. CONTRAST:  74mL OMNIPAQUE IOHEXOL 350 MG/ML SOLN COMPARISON:  cxr12/2/21. FINDINGS: Cardiovascular: Satisfactory opacification of the pulmonary arteries to the segmental level. Segmental and subsegmental pulmonary emboli of all 5 lobes. Enlarged right to left ventricular ratio of 1.1. Straightening of the interventricular septum. Otherwise normal heart size. No pericardial effusion. Mild atherosclerotic plaque. Four-vessel coronary calcifications. Mediastinum/Nodes: No enlarged mediastinal, hilar, or axillary lymph nodes. Thyroid gland, trachea, and esophagus demonstrate no significant findings. Lungs/Pleura: There is a 1 x 0.7 cm subpleural nodule within the right upper lobe. There is a 1.2 x 0.9 cm subpleural right lower lobe pulmonary (6:104). No focal consolidation.  No pulmonary nodule. No pulmonary mass. No pleural effusion. No pneumothorax. Upper Abdomen: No acute abnormality. Musculoskeletal: No chest wall abnormality. No suspicious lytic or blastic osseous lesions. No acute displaced fracture. Multilevel degenerative changes of the spine. Review of the MIP images confirms the above findings. IMPRESSION: 1. Segmental and subsegmental pulmonary emboli of all 5 lobes. Associated developing right heart strain. No pulmonary infarction. Positive for acute PE with CT evidence of right heart strain (RV/LV Ratio = 1.1) consistent with at least submassive (intermediate risk) PE. The presence of right heart strain has been associated with an increased risk of morbidity and mortality. Please refer to the "PE Focused" order set in EPIC. 2. Couple of subcentimeter subpleural nodule within the right lung. Non-contrast chest CT at 3-6 months is recommended. If the nodules are stable at time of repeat CT, then future CT at 18-24 months (from today's scan) is considered optional for low-risk patients, but is  recommended for high-risk patients. This recommendation follows the consensus statement: Guidelines for Management of Incidental Pulmonary Nodules Detected on CT Images: From the Fleischner Society 2017; Radiology 2017; 284:228-243. 3.  Aortic Atherosclerosis (ICD10-I70.0). These results were called by telephone at the time of interpretation on 04/28/2021 at 7:00 pm to provider [facility] , who verbally acknowledged these results. Electronically Signed   By: Iven Finn M.D.   On: 04/28/2021 19:05   US Venous Img Lower Unilateral Right (DVT)  Result Date: 04/28/2021 CLINICAL DATA:  Edema, pain EXAM: RIGHT LOWER EXTREMITY VENOUS DOPPLER ULTRASOUND TECHNIQUE: Gray-scale sonography with compression, as well as color and duplex ultrasound, were performed to evaluate the deep venous system(s) from the level of the common femoral vein through the popliteal and proximal calf veins. COMPARISON:  None. FINDINGS: VENOUS Hypoechoic heterogenous intraluminal thrombus in the common femoral vein. The saphenofemoral junction remains patent. There is hypoechoic occlusive thrombus in the deep femoral vein. Hypoechoic non compressible thrombus through the femoral and popliteal veins with limited flow signal on color Doppler. Occlusive thrombus in posterior tibial and peroneal veins in the calf. Antegrade color flow signal noted in the visualized portion of the right external iliac vein. Limited views of the contralateral common femoral vein are unremarkable. OTHER None. Limitations: none IMPRESSION: 1. POSITIVE for right lower extremity acute DVT extending from posterior tibial and peroneal calf veins through the popliteal and femoral veins as above. Electronically Signed   By: Lucrezia Europe M.D.   On: 04/28/2021 13:53   ECHOCARDIOGRAM COMPLETE  Result Date: 04/29/2021    ECHOCARDIOGRAM REPORT   Patient Name:   LATASHA PUSKAS Date of Exam: 04/29/2021 Medical Rec #:  [id number]       Height:       69.0 in Accession #:     [id number]      Weight:       247.6 lb Date of Birth:  [birth year]       BSA:          2.262 m Patient Age:    14 years        BP:           120/77 mmHg Patient Gender: M               HR:           83 bpm. Exam Location:  Inpatient Procedure: 2D Echo, Cardiac Doppler and Color Doppler Indications:    Pulmonary Embolus  History:        Patient has prior history of  Echocardiogram examinations, most                 recent 09/26/2007. Risk Factors:Hypertension and Dyslipidemia.                 DVT.  Sonographer:    Wenda Low Referring Phys: [id number] [place]  1. Left ventricular ejection fraction, by estimation, is 60 to 65%. The left ventricle has normal function. The left ventricle has no regional wall motion abnormalities. There is severe concentric left ventricular hypertrophy. Left ventricular diastolic  parameters were normal.  2. Right ventricular systolic function is normal. The right ventricular size is normal.  3. Right atrial size was mildly dilated.  4. The mitral valve is normal in structure. No evidence of mitral valve regurgitation. No evidence of mitral stenosis.  5. The aortic valve is calcified. Aortic valve regurgitation is not visualized. Mild to moderate aortic valve sclerosis/calcification is present, without any evidence of aortic stenosis. Aortic valve area, by VTI measures 1.99 cm. Aortic valve mean gradient measures 6.0 mmHg. Aortic valve Vmax measures 1.72 m/s.  6. Aortic dilatation noted. There is borderline dilatation of the aortic root, measuring 37 mm. There is mild dilatation of the ascending aorta, measuring 39 mm. FINDINGS  Left Ventricle: Left ventricular ejection fraction, by estimation, is 60 to 65%. The left ventricle has normal function. The left ventricle has no regional wall motion abnormalities. The left ventricular internal cavity size was normal in size. There is  severe concentric left ventricular hypertrophy. Left ventricular diastolic parameters  were normal. Normal left ventricular filling pressure. Right Ventricle: The right ventricular size is normal. No increase in right ventricular wall thickness. Right ventricular systolic function is normal. Left Atrium: Left atrial size was normal in size. Right Atrium: Right atrial size was mildly dilated. Pericardium: There is no evidence of pericardial effusion. Mitral Valve: The mitral valve is normal in structure. No evidence of mitral valve regurgitation. No evidence of mitral valve stenosis. MV peak gradient, 2.8 mmHg. The mean mitral valve gradient is 1.0 mmHg. Tricuspid Valve: The tricuspid valve is normal in structure. Tricuspid valve regurgitation is trivial. No evidence of tricuspid stenosis. Aortic Valve: The aortic valve is calcified. Aortic valve regurgitation is not visualized. Mild to moderate aortic valve sclerosis/calcification is present, without any evidence of aortic stenosis. Aortic valve mean gradient measures 6.0 mmHg. Aortic valve peak gradient measures 11.8 mmHg. Aortic valve area, by VTI measures 1.99 cm. Pulmonic Valve: The pulmonic valve was normal in structure. Pulmonic valve regurgitation is trivial. No evidence of pulmonic stenosis. Aorta: Aortic dilatation noted. There is borderline dilatation of the aortic root, measuring 37 mm. There is mild dilatation of the ascending aorta, measuring 39 mm. Venous: The inferior vena cava was not well visualized. IAS/Shunts: No atrial level shunt detected by color flow Doppler.  LEFT VENTRICLE PLAX 2D LVIDd:         3.70 cm     Diastology LVIDs:         2.50 cm     LV e' medial:    8.05 cm/s LV PW:         1.60 cm     LV E/e' medial:  10.5 LV IVS:        1.70 cm     LV e' lateral:   7.51 cm/s LVOT diam:     2.20 cm     LV E/e' lateral: 11.2 LV SV:         68 LV SV  Index:   30 LVOT Area:     3.80 cm  LV Volumes (MOD) LV vol d, MOD A2C: 71.5 ml LV vol d, MOD A4C: 76.9 ml LV vol s, MOD A2C: 32.1 ml LV vol s, MOD A4C: 33.8 ml LV SV MOD A2C:     39.4  ml LV SV MOD A4C:     76.9 ml LV SV MOD BP:      43.1 ml RIGHT VENTRICLE RV Basal diam:  3.60 cm RV Mid diam:    2.80 cm TAPSE (M-mode): 3.0 cm LEFT ATRIUM             Index        RIGHT ATRIUM           Index LA diam:        4.10 cm 1.81 cm/m   RA Area:     24.10 cm LA Vol (A2C):   53.5 ml 23.65 ml/m  RA Volume:   77.90 ml  34.43 ml/m LA Vol (A4C):   57.2 ml 25.28 ml/m LA Biplane Vol: 56.9 ml 25.15 ml/m  AORTIC VALVE                     PULMONIC VALVE AV Area (Vmax):    2.21 cm      PV Vmax:       0.76 m/s AV Area (Vmean):   2.08 cm      PV Peak grad:  2.3 mmHg AV Area (VTI):     1.99 cm AV Vmax:           172.00 cm/s AV Vmean:          115.000 cm/s AV VTI:            0.342 m AV Peak Grad:      11.8 mmHg AV Mean Grad:      6.0 mmHg LVOT Vmax:         99.90 cm/s LVOT Vmean:        63.000 cm/s LVOT VTI:          0.179 m LVOT/AV VTI ratio: 0.52  AORTA Ao Root diam: 3.70 cm Ao Asc diam:  3.90 cm MITRAL VALVE               TRICUSPID VALVE MV Area (PHT): 3.51 cm    TR Peak grad:   10.1 mmHg MV Area VTI:   2.55 cm    TR Vmax:        159.00 cm/s MV Peak grad:  2.8 mmHg MV Mean grad:  1.0 mmHg    SHUNTS MV Vmax:       0.83 m/s    Systemic VTI:  0.18 m MV Vmean:      57.4 cm/s   Systemic Diam: 2.20 cm MV Decel Time: 216 msec MV E velocity: 84.40 cm/s MV A velocity: 83.60 cm/s MV E/A ratio:  1.01 Fransico Him MD Electronically signed by Fransico Him MD Signature Date/Time: 04/29/2021/12:18:58 PM    Final        The results of significant diagnostics from this hospitalization (including imaging, microbiology, ancillary and laboratory) are listed below for reference.     Microbiology: Recent Results (from the past 240 hour(s))  Resp Panel by RT-PCR (Flu A&B, Covid) Nasopharyngeal Swab     Status: None   Collection Time: 04/28/21  7:13 PM   Specimen: Nasopharyngeal Swab; Nasopharyngeal(NP) swabs in vial transport medium  Result Value Ref Range Status  SARS Coronavirus 2 by RT PCR NEGATIVE NEGATIVE Final     Comment: (NOTE) SARS-CoV-2 target nucleic acids are NOT DETECTED.  The SARS-CoV-2 RNA is generally detectable in upper respiratory specimens during the acute phase of infection. The lowest concentration of SARS-CoV-2 viral copies this assay can detect is 138 copies/mL. A negative result does not preclude SARS-Cov-2 infection and should not be used as the sole basis for treatment or other patient management decisions. A negative result may occur with  improper specimen collection/handling, submission of specimen other than nasopharyngeal swab, presence of viral mutation(s) within the areas targeted by this assay, and inadequate number of viral copies(<138 copies/mL). A negative result must be combined with clinical observations, patient history, and epidemiological information. The expected result is Negative.  Fact Sheet for Patients:  EntrepreneurPulse.com.au  Fact Sheet for Healthcare Providers:  IncredibleEmployment.be  This test is no t yet approved or cleared by the [place] FDA and  has been authorized for detection and/or diagnosis of SARS-CoV-2 by FDA under an Emergency Use Authorization (EUA). This EUA will remain  in effect (meaning this test can be used) for the duration of the COVID-19 declaration under Section 564(b)(1) of the Act, 21 U.S.C.section 360bbb-3(b)(1), unless the authorization is terminated  or revoked sooner.       Influenza A by PCR NEGATIVE NEGATIVE Final   Influenza B by PCR NEGATIVE NEGATIVE Final    Comment: (NOTE) The Xpert Xpress SARS-CoV-2/FLU/RSV plus assay is intended as an aid in the diagnosis of influenza from Nasopharyngeal swab specimens and should not be used as a sole basis for treatment. Nasal washings and aspirates are unacceptable for Xpert Xpress SARS-CoV-2/FLU/RSV testing.  Fact Sheet for Patients: EntrepreneurPulse.com.au  Fact Sheet for Healthcare  Providers: IncredibleEmployment.be  This test is not yet approved or cleared by the [place] FDA and has been authorized for detection and/or diagnosis of SARS-CoV-2 by FDA under an Emergency Use Authorization (EUA). This EUA will remain in effect (meaning this test can be used) for the duration of the COVID-19 declaration under Section 564(b)(1) of the Act, 21 U.S.C. section 360bbb-3(b)(1), unless the authorization is terminated or revoked.  Performed at [facility], [address] [place], [place] [address]      Labs:  CBC: Recent Labs  Lab 04/28/21 1719 04/29/21 0314 04/30/21 0552  WBC 7.5 7.8 7.3  NEUTROABS 4.1  --   --   HGB 14.4 13.8 13.2  HCT 40.8 38.1* 37.5*  MCV 88.7 87.8 88.4  PLT 218 222 221   BMP &GFR Recent Labs  Lab 04/28/21 1719 04/30/21 0552  NA 140 138  K 3.9 3.8  CL 106 107  CO2 27 25  GLUCOSE 101* 130*  BUN 11 7*  CREATININE 0.97 0.98  CALCIUM 9.5 8.8*   Estimated Creatinine Clearance: 83.7 mL/min (by C-G formula based on SCr of 0.98 mg/dL). Liver & Pancreas: No results for input(s): AST, ALT, ALKPHOS, BILITOT, PROT, ALBUMIN in the last 168 hours. No results for input(s): LIPASE, AMYLASE in the last 168 hours. No results for input(s): AMMONIA in the last 168 hours. Diabetic: Recent Labs    04/29/21 0320  HGBA1C 7.1*   Recent Labs  Lab 04/29/21 1100 04/29/21 1634 04/29/21 2049 04/30/21 0606 04/30/21 1101  GLUCAP 119* 122* 123* 136* 155*   Cardiac Enzymes: No results for input(s): CKTOTAL, CKMB, CKMBINDEX, TROPONINI in the last 168 hours. No results for input(s): PROBNP in the last 8760 hours. Coagulation Profile: No results for input(s):  INR, PROTIME in the last 168 hours. Thyroid Function Tests: No results for input(s): TSH, T4TOTAL, FREET4, T3FREE, THYROIDAB in the last 72 hours. Lipid Profile: No results for input(s): CHOL, HDL, LDLCALC, TRIG, CHOLHDL, LDLDIRECT in the last 72  hours. Anemia Panel: No results for input(s): VITAMINB12, FOLATE, FERRITIN, TIBC, IRON, RETICCTPCT in the last 72 hours. Urine analysis: No results found for: COLORURINE, APPEARANCEUR, LABSPEC, PHURINE, GLUCOSEU, HGBUR, BILIRUBINUR, KETONESUR, PROTEINUR, UROBILINOGEN, NITRITE, LEUKOCYTESUR Sepsis Labs: Invalid input(s): PROCALCITONIN, LACTICIDVEN   Time coordinating discharge: 55 minutes  SIGNED:  Mercy Riding, MD  [facility] 04/30/2021, 4:15 PM

## 2021-04-30 NOTE — Progress Notes (Addendum)
ANTICOAGULATION CONSULT NOTE  Pharmacy Consult for heparin Indication: pulmonary embolus  Allergies  Allergen Reactions   Lotrisone [Clotrimazole-Betamethasone] Rash   Penicillins Hives and Swelling    Patient Measurements: Height: 5\' 9"  (175.3 cm) Weight: 111.1 kg (244 lb 14.4 oz) IBW/kg (Calculated) : 70.7 Heparin Dosing Weight: 93.5 kg  Vital Signs: Temp: 97.5 F (36.4 C) (10/30 0319) Temp Source: Oral (10/30 0319) BP: 135/82 (10/30 0319) Pulse Rate: 77 (10/30 0319)  Labs: Recent Labs    04/28/21 1719 04/28/21 1903 04/28/21 2119 04/29/21 0314 04/29/21 1315 04/30/21 0552  HGB 14.4  --   --  13.8  --  13.2  HCT 40.8  --   --  38.1*  --  37.5*  PLT 218  --   --  222  --  221  HEPARINUNFRC  --   --   --  0.28* 0.45 0.59  CREATININE 0.97  --   --   --   --   --   TROPONINIHS  --  10 7  --   --   --      Estimated Creatinine Clearance: 84.6 mL/min (by C-G formula based on SCr of 0.97 mg/dL).  Assessment: 69 yo M with acute PE w/ RHS. Pharmacy consulted to dose heparin for PE. CBC wnl. No AC PTA.  Heparin level therapeutic (0.59) on gtt at 1800 units/hr. No issues with line or bleeding reported per RN.  Goal of Therapy:  Heparin level 0.3-0.7 units/ml Monitor platelets by anticoagulation protocol: Yes   Plan:  Continue heparin infusion at 1800 units/hr Daily heparin level and CBC  Thank you for allowing pharmacy to participate in this patient's care.  Reatha Harps, PharmD PGY1 Pharmacy Resident 04/30/2021 7:00 AM Check [email or web address] for unit specific pharmacy number  [facility] Pharmacy consulted to stop heparin and start Eliquis. Will initiate PE dosing of Eliquis.

## 2021-04-30 NOTE — Plan of Care (Signed)

## 2021-04-30 NOTE — Evaluation (Signed)
Physical Therapy Evaluation & Discharge  Patient Details Name: Eric Campbell MRN: [id number] DOB: 02/28/1951 Today's Date: 04/30/2021  History of Present Illness  70 y/o male presented to ED on 10/28 for R LE swelling since 10/21 and [place] was + for DVT in PCP office. CT angio of chest showed acute PE in all 5 lobes. PMH: asthma, BPH, congenital hearing loss, DM type 2, HLD, HTN, ED, sleep apnea  Clinical Impression  PTA, patient lives with wife and reports independence with use of quad cane for community mobility. Patient reports 5 falls in past 6 months. Patient presents with balance and strength deficits. Patient ambulating at supervision level for safety with no AD due to unsteadiness. Negotiated 4 stairs with two rails and min guard for safety. No further skilled PT needs required acutely. Recommend OPPT at discharge to address balance and strength deficits.       Recommendations for follow up therapy are one component of a multi-disciplinary discharge planning process, led by the attending physician.  Recommendations may be updated based on patient status, additional functional criteria and insurance authorization.  Follow Up Recommendations Outpatient PT    Assistance Recommended at Discharge PRN  Functional Status Assessment Patient has not had a recent decline in their functional status  Equipment Recommendations  None recommended by PT    Recommendations for Other Services       Precautions / Restrictions Precautions Precautions: Fall Restrictions Weight Bearing Restrictions: No      Mobility  Bed Mobility Overal bed mobility: Modified Independent                  Transfers Overall transfer level: Modified independent Equipment used: None                    Ambulation/Gait Ambulation/Gait assistance: Supervision Gait Distance (Feet): 200 Feet Assistive device: None Gait Pattern/deviations: Step-through pattern;Decreased stride length;Decreased  stance time - right;Decreased stance time - left;Wide base of support;Drifts right/left Gait velocity: decreased   General Gait Details: wide BOS and "waddling" gait pattern. Supervision for safety due to balance deficits  Stairs Stairs: Yes Stairs assistance: Min guard Stair Management: Two rails;Step to pattern;Forwards Number of Stairs: 4 General stair comments: min guard for safety, unsteady with stair negotiation  Wheelchair Mobility    Modified Rankin (Stroke Patients Only)       Balance Overall balance assessment: Mild deficits observed, not formally tested                                           Pertinent Vitals/Pain Pain Assessment: No/denies pain    Home Living Family/patient expects to be discharged to:: Private residence Living Arrangements: Spouse/significant other Available Help at Discharge: Family Type of Home: House Home Access: Stairs to enter Entrance Stairs-Rails: Right;Left;Can reach both Entrance Stairs-Number of Steps: 6   Home Layout: Two level;Other (Comment) (does not access 2nd floor) Home Equipment: Cane - quad      Prior Function Prior Level of Function : Independent/Modified Independent;Driving;Working/employed;History of Falls (last six months)             Mobility Comments: reports 5 falls in past 6 months. Uses quad cane for community mobility       Hand Dominance        Extremity/Trunk Assessment   Upper Extremity Assessment Upper Extremity Assessment: Generalized weakness    Lower  Extremity Assessment Lower Extremity Assessment: Generalized weakness;LLE deficits/detail LLE Deficits / Details: L LE smaller girth than R LE due to "nerve damage" per patient report. LLE Sensation: decreased light touch (on anterior thigh)    Cervical / Trunk Assessment Cervical / Trunk Assessment: Normal  Communication   Communication: No difficulties  Cognition Arousal/Alertness: Awake/alert Behavior During  Therapy: WFL for tasks assessed/performed Overall Cognitive Status: Within Functional Limits for tasks assessed                                          General Comments      Exercises     Assessment/Plan    PT Assessment All further PT needs can be met in the next venue of care  PT Problem List Decreased strength;Decreased activity tolerance;Decreased mobility;Decreased balance;Cardiopulmonary status limiting activity       PT Treatment Interventions      PT Goals (Current goals can be found in the Care Plan section)  Acute Rehab PT Goals Patient Stated Goal: to go home and get stronger PT Goal Formulation: All assessment and education complete, DC therapy    Frequency     Barriers to discharge        Co-evaluation               AM-PAC PT "6 Clicks" Mobility  Outcome Measure Help needed turning from your back to your side while in a flat bed without using bedrails?: None Help needed moving from lying on your back to sitting on the side of a flat bed without using bedrails?: None Help needed moving to and from a bed to a chair (including a wheelchair)?: None Help needed standing up from a chair using your arms (e.g., wheelchair or bedside chair)?: None Help needed to walk in hospital room?: A Little Help needed climbing 3-5 steps with a railing? : A Little 6 Click Score: 22    End of Session   Activity Tolerance: Patient tolerated treatment well Patient left: in bed;with call bell/phone within reach Nurse Communication: Mobility status PT Visit Diagnosis: Unsteadiness on feet (R26.81)    Time: 9381-0175 PT Time Calculation (min) (ACUTE ONLY): 28 min   Charges:   PT Evaluation $PT Eval Low Complexity: 1 Low PT Treatments $Gait Training: 8-22 mins        Eric Campbell PT, DPT Acute Rehabilitation Services Pager [phone] Office [phone]   Eric Campbell 04/30/2021, 9:25 AM

## 2021-05-01 ENCOUNTER — Telehealth (HOSPITAL_COMMUNITY): Payer: Self-pay | Admitting: Pharmacist

## 2021-05-01 LAB — HOMOCYSTEINE: Homocysteine: 16.2 umol/L (ref 0.0–19.2)

## 2021-05-01 LAB — PROTEIN C ACTIVITY: Protein C Activity: 149 % (ref 73–180)

## 2021-05-01 LAB — PROTEIN S ACTIVITY: Protein S Activity: 124 % (ref 63–140)

## 2021-05-01 LAB — BETA-2-GLYCOPROTEIN I ABS, IGG/M/A
Beta-2 Glyco I IgG: <9 GPI IgG units (ref 0–20)
Beta-2-Glycoprotein I IgA: <9 GPI IgA units (ref 0–25)
Beta-2-Glycoprotein I IgM: <9 GPI IgM units (ref 0–32)

## 2021-05-01 LAB — PROTEIN S, TOTAL: Protein S Ag, Total: 145 % (ref 60–150)

## 2021-05-01 LAB — PROTEIN C, TOTAL: Protein C, Total: 121 % (ref 60–150)

## 2021-05-01 LAB — LUPUS ANTICOAGULANT PANEL
DRVVT: 41.4 s (ref 0.0–47.0)
PTT Lupus Anticoagulant: 31 s (ref 0.0–51.9)

## 2021-05-04 LAB — CARDIOLIPIN ANTIBODIES, IGG, IGM, IGA
Anticardiolipin IgA: <9 APL U/mL (ref 0–11)
Anticardiolipin IgG: <9 GPL U/mL (ref 0–14)
Anticardiolipin IgM: 10 MPL U/mL (ref 0–12)

## 2021-05-04 LAB — FACTOR 5 LEIDEN

## 2021-05-05 LAB — PROTHROMBIN GENE MUTATION

## 2021-05-08 ENCOUNTER — Other Ambulatory Visit: Payer: Self-pay | Admitting: Neurological Surgery

## 2021-05-11 DIAGNOSIS — R5381 Other malaise: Secondary | ICD-10-CM | POA: Diagnosis not present

## 2021-05-11 DIAGNOSIS — R918 Other nonspecific abnormal finding of lung field: Secondary | ICD-10-CM | POA: Diagnosis not present

## 2021-05-11 DIAGNOSIS — E291 Testicular hypofunction: Secondary | ICD-10-CM | POA: Diagnosis not present

## 2021-05-11 DIAGNOSIS — I2699 Other pulmonary embolism without acute cor pulmonale: Secondary | ICD-10-CM | POA: Diagnosis not present

## 2021-05-11 DIAGNOSIS — M5416 Radiculopathy, lumbar region: Secondary | ICD-10-CM | POA: Diagnosis not present

## 2021-05-12 ENCOUNTER — Telehealth: Payer: Self-pay | Admitting: Nurse Practitioner

## 2021-05-12 NOTE — Telephone Encounter (Signed)
Scheduled appt per 11/11 referral. Pt is aware of appt date and time.  

## 2021-05-16 DIAGNOSIS — Z6837 Body mass index (BMI) 37.0-37.9, adult: Secondary | ICD-10-CM | POA: Diagnosis not present

## 2021-05-16 DIAGNOSIS — I82409 Acute embolism and thrombosis of unspecified deep veins of unspecified lower extremity: Secondary | ICD-10-CM | POA: Insufficient documentation

## 2021-05-16 DIAGNOSIS — I10 Essential (primary) hypertension: Secondary | ICD-10-CM | POA: Diagnosis not present

## 2021-05-16 NOTE — Progress Notes (Incomplete Revision)
[place]   [phone] Fax:([phone]) [place] consult Note   Patient Care Team: Antony Contras, MD as PCP - General (Family Medicine) Sueanne Margarita, MD as PCP - Sleep Medicine (Sleep Medicine) Date of Service: 05/17/2021  CHIEF COMPLAINTS/PURPOSE OF CONSULTATION:  DVT/PE, referred by PCP Dr. Antony Contras  HISTORY OF PRESENTING ILLNESS:  Eric Campbell 70 y.o. male with PMH including HTN, OSA on CPAP, asthma, DM, HL, hypogonadism on topical testosterone x10-15 years, radiculopathy, spinal stenosis with sedentary lifestyle, and osteopenia here because of newly diagnosed DVT/PE. He developed painful leg swelling 04/2021, PCP ordered doppler 04/28/21 which was positive for RLE acute DVT extending from the posterior tibial and peroneal calf veins through the popliteal and femoral veins. He was sent to ED for further eval/work up including CTA 04/28/21 which showed segmental and subsegmental pulmonary emboli in all 5 lobes and developing right heart strain c/w at least submassive PE, intermediate risk. There were 2 small 1 cm right lung nodules that were found incidentally. He was admitted for management. Echo showed LVEF 60-65% and no RV heart strain. Prior to [facility], hypercoagulable panel was drawn which showed normal Antithrombin III, protein S and C activity, lupus anticoagulant, beta-2 glycoprotein and cardiolipin antibodies, homocysteine, factor 5, and prothrombin gene mutation overall negative. He was initiated on heparin and transitioned to eliquis at discharge on 04/30/21. He denies history of recent travel, injury, surgery, or malignancy. 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 He also notes 4-6 weeks ago he tested positive for covid, took paxlovid, then developed uncontrollable hyperglycemia up to 475 and rapid 25 lbs weight loss. He eventually recovered.   Socially, he is married with 3 adult children.  He works in Psychiatrist.  He is independent with ADLs.  Drinks alcohol sparingly on special occasions, denies tobacco or other drug use.  He has had a colonoscopy, unsure if he has had PSA checked.  His brother had cancer of unknown type, may be testicular or lung.  Today he presents by himself, ambulates with cane.  Right leg still swollen but improved.  He has chronic back pain but none when sitting.  Also has chronic exertional dyspnea for many years which is stable.  He did not note any cough, chest pain, or increased dyspnea at the time of DVT/PE. Takes Symbicort for adult asthma.  He is tolerating Eliquis without bruising or bleeding.    MEDICAL HISTORY:  Past Medical History:  Diagnosis Date   Allergic rhinitis    Allergy    Asthma    BPH (benign prostatic hyperplasia)    Colon polyps    Congenital hearing loss    Diabetes mellitus    diet controlled   Dyslipidemia    ED (erectile dysfunction)    Hyperlipidemia    Hypertension    Hypogonadism male    Obesity    Osteopenia    Seasonal allergies    Sleep apnea     wears cpap   Torn ligament    left ankle   Vitamin D deficiency     SURGICAL HISTORY: Past Surgical History:  Procedure Laterality Date   ACHILLES TENDON SURGERY Left    ankle   COLONOSCOPY     POLYPECTOMY     UMBILICAL HERNIA REPAIR  1998    SOCIAL HISTORY: Social History   Socioeconomic History   Marital status: Married    Spouse name: donna  Number of children: 3   Years of education: college   Highest education level: Not on file  Occupational History   Occupation: [employer]/retired  Tobacco Use   Smoking status: Never   Smokeless tobacco: Never  Vaping Use   Vaping Use: Never used  Substance and Sexual Activity   Alcohol use: Not Currently    Comment: at events like weddings   Drug use: No   Sexual activity: Not on file  Other Topics Concern   Not on file  Social History Narrative   Tobacco use cigarettes:  Never smoked. Tobacco history last upated 10/27/2013 no smoking Alcohol : Rare>> No Recreational drug use.   Social Determinants of Health   Financial Resource Strain: Not on file  Food Insecurity: Not on file  Transportation Needs: Not on file  Physical Activity: Not on file  Stress: Not on file  Social Connections: Not on file  Intimate Partner Violence: Not on file    FAMILY HISTORY: Family History  Problem Relation Age of Onset   Pancreatic cancer Mother 46   COPD Mother    Emphysema Mother    Other Mother        had Colostomy-    COPD Father    Heart attack Father    Emphysema Father    Ulcerative colitis Grandchild 41   Diabetes Sister    Cancer Brother 35       type unknown   Colon cancer Neg Hx    Stomach cancer Neg Hx    Rectal cancer Neg Hx    Colon polyps Neg Hx    Esophageal cancer Neg Hx     ALLERGIES:  is allergic to lotrisone [clotrimazole-betamethasone], penicillins, and cephalexin.  MEDICATIONS:  Current Outpatient Medications  Medication Sig Dispense Refill   albuterol (PROVENTIL HFA;VENTOLIN HFA) 108 (90 BASE) MCG/ACT inhaler Inhale 2 puffs into the lungs every 4 (four) hours as needed for wheezing or shortness of breath.     alclomethasone (ACLOVATE) 0.05 % cream Apply 1 application topically 2 (two) times daily.     [START ON 05/30/2021] apixaban (ELIQUIS) 5 MG TABS tablet Take 1 tablet (5 mg total) by mouth 2 (two) times daily. Start this once you finish starter pack 60 tablet 1   APIXABAN (ELIQUIS) VTE STARTER PACK (10MG  AND 5MG ) Take as directed on package: start with two-5mg  tablets twice daily for 7 days. On day 8, switch to one-5mg  tablet twice daily. 1 each 0   atorvastatin (LIPITOR) 20 MG tablet Take 20 mg by mouth daily.      augmented betamethasone dipropionate (DIPROLENE-AF) 0.05 % cream Apply 1 application topically in the morning and at bedtime.     B Complex-Folic Acid (B COMPLEX-VITAMIN B12 PO) Take 2-3 tablets by mouth daily.      budesonide-formoterol (SYMBICORT) 80-4.5 MCG/ACT inhaler 2 puffs in the morning right when you wake up, rinse out your mouth after use, 12 hours later 2 puffs, rinse after use.Take this daily, no matter what.This is not a rescue inhaler 1 each 12   Calcium Carbonate 1500 (600 CA) MG TABS Take 1 tablet by mouth daily.      Continuous Blood Gluc Sensor (FREESTYLE LIBRE 14 DAY SENSOR) MISC as directed every 2 weeks     Continuous Blood Gluc Sensor (FREESTYLE LIBRE 14 DAY SENSOR) MISC Apply topically every 14 (fourteen) days.     losartan (COZAAR) 100 MG tablet Take 100 mg by mouth daily.     metFORMIN (GLUCOPHAGE)  1000 MG tablet Take 1,000 mg by mouth 2 (two) times daily.     Omega-3 Fatty Acids (FISH OIL) 1000 MG CAPS Take 1 capsule by mouth daily.     OneTouch Delica Lancets 74B MISC TEST BLOOD GLUCOSE ONCE DAILY/ DX E11.65     SHINGRIX injection      Testosterone 20.25 MG/ACT (1.62%) GEL Apply 2 Pump topically daily.     triamcinolone cream (KENALOG) 0.1 % Apply 1 application topically daily as needed for rash.     Vitamin D, Ergocalciferol, 2000 units CAPS Take 2 capsules by mouth 2 (two) times daily.     No current facility-administered medications for this visit.    REVIEW OF SYSTEMS:   Constitutional: Denies fevers, chills, abnormal night sweats (+) unintentional weight loss with COVID 1 month ago Eyes: Denies blurriness of vision, double vision or watery eyes Ears, nose, mouth, throat, and face: Denies mucositis or sore throat Respiratory: Denies cough (+)  wheezes (+) asthma (+) chronic dyspnea Cardiovascular: Denies palpitation, chest discomfort (+) right greater than left lower extremity swelling Gastrointestinal:  Denies nausea, heartburn or change in bowel habits Skin: Denies abnormal skin rashes Lymphatics: Denies new lymphadenopathy or easy bruising Neurological:Denies numbness, tingling or new weaknesses (+) spinal stenosis Behavioral/Psych: Mood is stable, no new changes  All  other systems were reviewed with the patient and are negative.  PHYSICAL EXAMINATION: ECOG PERFORMANCE STATUS: 1 - Symptomatic but completely ambulatory  Vitals:   05/17/21 1101  BP: (!) 143/87  Pulse: 81  Resp: 20  Temp: 97.9 F (36.6 C)  SpO2: 96%   Filed Weights   05/17/21 1101  Weight: 253 lb 9.6 oz (115 kg)    GENERAL:alert, no distress and comfortable SKIN: skin eruptions on the lower legs EYES: sclera clear LUNGS:  normal breathing effort HEART: R>L lower extremity edema ABDOMEN:abdomen round Musculoskeletal:no cyanosis of digits and no clubbing  PSYCH: alert & oriented x 3 with fluent speech  LABORATORY DATA:  I have reviewed the data as listed CBC Latest Ref Rng & Units 04/30/2021 04/29/2021 04/28/2021  WBC 4.0 - 10.5 K/uL 7.3 7.8 7.5  Hemoglobin 13.0 - 17.0 g/dL 13.2 13.8 14.4  Hematocrit 39.0 - 52.0 % 37.5(L) 38.1(L) 40.8  Platelets 150 - 400 K/uL 221 222 218    CMP Latest Ref Rng & Units 04/30/2021 04/28/2021  Glucose 70 - 99 mg/dL 130(H) 101(H)  BUN 8 - 23 mg/dL 7(L) 11  Creatinine 0.61 - 1.24 mg/dL 0.98 0.97  Sodium 135 - 145 mmol/L 138 140  Potassium 3.5 - 5.1 mmol/L 3.8 3.9  Chloride 98 - 111 mmol/L 107 106  CO2 22 - 32 mmol/L 25 27  Calcium 8.9 - 10.3 mg/dL 8.8(L) 9.5     RADIOGRAPHIC STUDIES: I have personally reviewed the radiological images as listed and agreed with the findings in the report. CT Angio Chest PE W and/or Wo Contrast  Result Date: 04/28/2021 CLINICAL DATA:  PE suspected, high prob. tolerated w/o complication or reaction Pt arrives to ED with c/o right right leg x1 week. Pt reports he had an outpatient [place] that showed an acute DVT in the right leg. EXAM: CT ANGIOGRAPHY CHEST WITH CONTRAST TECHNIQUE: Multidetector CT imaging of the chest was performed using the standard protocol during bolus administration of intravenous contrast. Multiplanar CT image reconstructions and MIPs were obtained to evaluate the vascular anatomy.  CONTRAST:  41mL OMNIPAQUE IOHEXOL 350 MG/ML SOLN COMPARISON:  cxr12/2/21. FINDINGS: Cardiovascular: Satisfactory opacification of the pulmonary arteries to the  segmental level. Segmental and subsegmental pulmonary emboli of all 5 lobes. Enlarged right to left ventricular ratio of 1.1. Straightening of the interventricular septum. Otherwise normal heart size. No pericardial effusion. Mild atherosclerotic plaque. Four-vessel coronary calcifications. Mediastinum/Nodes: No enlarged mediastinal, hilar, or axillary lymph nodes. Thyroid gland, trachea, and esophagus demonstrate no significant findings. Lungs/Pleura: There is a 1 x 0.7 cm subpleural nodule within the right upper lobe. There is a 1.2 x 0.9 cm subpleural right lower lobe pulmonary (6:104). No focal consolidation. No pulmonary nodule. No pulmonary mass. No pleural effusion. No pneumothorax. Upper Abdomen: No acute abnormality. Musculoskeletal: No chest wall abnormality. No suspicious lytic or blastic osseous lesions. No acute displaced fracture. Multilevel degenerative changes of the spine. Review of the MIP images confirms the above findings. IMPRESSION: 1. Segmental and subsegmental pulmonary emboli of all 5 lobes. Associated developing right heart strain. No pulmonary infarction. Positive for acute PE with CT evidence of right heart strain (RV/LV Ratio = 1.1) consistent with at least submassive (intermediate risk) PE. The presence of right heart strain has been associated with an increased risk of morbidity and mortality. Please refer to the "PE Focused" order set in EPIC. 2. Couple of subcentimeter subpleural nodule within the right lung. Non-contrast chest CT at 3-6 months is recommended. If the nodules are stable at time of repeat CT, then future CT at 18-24 months (from today's scan) is considered optional for low-risk patients, but is recommended for high-risk patients. This recommendation follows the consensus statement: Guidelines for Management of  Incidental Pulmonary Nodules Detected on CT Images: From the Fleischner Society 2017; Radiology 2017; 284:228-243. 3.  Aortic Atherosclerosis (ICD10-I70.0). These results were called by telephone at the time of interpretation on 04/28/2021 at 7:00 pm to provider [facility] , who verbally acknowledged these results. Electronically Signed   By: Iven Finn M.D.   On: 04/28/2021 19:05   US Venous Img Lower Unilateral Right (DVT)  Result Date: 04/28/2021 CLINICAL DATA:  Edema, pain EXAM: RIGHT LOWER EXTREMITY VENOUS DOPPLER ULTRASOUND TECHNIQUE: Gray-scale sonography with compression, as well as color and duplex ultrasound, were performed to evaluate the deep venous system(s) from the level of the common femoral vein through the popliteal and proximal calf veins. COMPARISON:  None. FINDINGS: VENOUS Hypoechoic heterogenous intraluminal thrombus in the common femoral vein. The saphenofemoral junction remains patent. There is hypoechoic occlusive thrombus in the deep femoral vein. Hypoechoic non compressible thrombus through the femoral and popliteal veins with limited flow signal on color Doppler. Occlusive thrombus in posterior tibial and peroneal veins in the calf. Antegrade color flow signal noted in the visualized portion of the right external iliac vein. Limited views of the contralateral common femoral vein are unremarkable. OTHER None. Limitations: none IMPRESSION: 1. POSITIVE for right lower extremity acute DVT extending from posterior tibial and peroneal calf veins through the popliteal and femoral veins as above. Electronically Signed   By: Lucrezia Europe M.D.   On: 04/28/2021 13:53   ECHOCARDIOGRAM COMPLETE  Result Date: 04/29/2021    ECHOCARDIOGRAM REPORT   Patient Name:   Eric Campbell Date of Exam: 04/29/2021 Medical Rec #:  [id number]       Height:       69.0 in Accession #:    [id number]      Weight:       247.6 lb Date of Birth:  07-26-1950       BSA:          2.262 m Patient Age:  70  years        BP:           120/77 mmHg Patient Gender: M               HR:           83 bpm. Exam Location:  Inpatient Procedure: 2D Echo, Cardiac Doppler and Color Doppler Indications:    Pulmonary Embolus  History:        Patient has prior history of Echocardiogram examinations, most                 recent 09/26/2007. Risk Factors:Hypertension and Dyslipidemia.                 DVT.  Sonographer:    Wenda Low Referring Phys: [id number] [place]  1. Left ventricular ejection fraction, by estimation, is 60 to 65%. The left ventricle has normal function. The left ventricle has no regional wall motion abnormalities. There is severe concentric left ventricular hypertrophy. Left ventricular diastolic  parameters were normal.  2. Right ventricular systolic function is normal. The right ventricular size is normal.  3. Right atrial size was mildly dilated.  4. The mitral valve is normal in structure. No evidence of mitral valve regurgitation. No evidence of mitral stenosis.  5. The aortic valve is calcified. Aortic valve regurgitation is not visualized. Mild to moderate aortic valve sclerosis/calcification is present, without any evidence of aortic stenosis. Aortic valve area, by VTI measures 1.99 cm. Aortic valve mean gradient measures 6.0 mmHg. Aortic valve Vmax measures 1.72 m/s.  6. Aortic dilatation noted. There is borderline dilatation of the aortic root, measuring 37 mm. There is mild dilatation of the ascending aorta, measuring 39 mm. FINDINGS  Left Ventricle: Left ventricular ejection fraction, by estimation, is 60 to 65%. The left ventricle has normal function. The left ventricle has no regional wall motion abnormalities. The left ventricular internal cavity size was normal in size. There is  severe concentric left ventricular hypertrophy. Left ventricular diastolic parameters were normal. Normal left ventricular filling pressure. Right Ventricle: The right ventricular size is normal. No  increase in right ventricular wall thickness. Right ventricular systolic function is normal. Left Atrium: Left atrial size was normal in size. Right Atrium: Right atrial size was mildly dilated. Pericardium: There is no evidence of pericardial effusion. Mitral Valve: The mitral valve is normal in structure. No evidence of mitral valve regurgitation. No evidence of mitral valve stenosis. MV peak gradient, 2.8 mmHg. The mean mitral valve gradient is 1.0 mmHg. Tricuspid Valve: The tricuspid valve is normal in structure. Tricuspid valve regurgitation is trivial. No evidence of tricuspid stenosis. Aortic Valve: The aortic valve is calcified. Aortic valve regurgitation is not visualized. Mild to moderate aortic valve sclerosis/calcification is present, without any evidence of aortic stenosis. Aortic valve mean gradient measures 6.0 mmHg. Aortic valve peak gradient measures 11.8 mmHg. Aortic valve area, by VTI measures 1.99 cm. Pulmonic Valve: The pulmonic valve was normal in structure. Pulmonic valve regurgitation is trivial. No evidence of pulmonic stenosis. Aorta: Aortic dilatation noted. There is borderline dilatation of the aortic root, measuring 37 mm. There is mild dilatation of the ascending aorta, measuring 39 mm. Venous: The inferior vena cava was not well visualized. IAS/Shunts: No atrial level shunt detected by color flow Doppler.  LEFT VENTRICLE PLAX 2D LVIDd:         3.70 cm     Diastology LVIDs:         2.50  cm     LV e' medial:    8.05 cm/s LV PW:         1.60 cm     LV E/e' medial:  10.5 LV IVS:        1.70 cm     LV e' lateral:   7.51 cm/s LVOT diam:     2.20 cm     LV E/e' lateral: 11.2 LV SV:         68 LV SV Index:   30 LVOT Area:     3.80 cm  LV Volumes (MOD) LV vol d, MOD A2C: 71.5 ml LV vol d, MOD A4C: 76.9 ml LV vol s, MOD A2C: 32.1 ml LV vol s, MOD A4C: 33.8 ml LV SV MOD A2C:     39.4 ml LV SV MOD A4C:     76.9 ml LV SV MOD BP:      43.1 ml RIGHT VENTRICLE RV Basal diam:  3.60 cm RV Mid diam:     2.80 cm TAPSE (M-mode): 3.0 cm LEFT ATRIUM             Index        RIGHT ATRIUM           Index LA diam:        4.10 cm 1.81 cm/m   RA Area:     24.10 cm LA Vol (A2C):   53.5 ml 23.65 ml/m  RA Volume:   77.90 ml  34.43 ml/m LA Vol (A4C):   57.2 ml 25.28 ml/m LA Biplane Vol: 56.9 ml 25.15 ml/m  AORTIC VALVE                     PULMONIC VALVE AV Area (Vmax):    2.21 cm      PV Vmax:       0.76 m/s AV Area (Vmean):   2.08 cm      PV Peak grad:  2.3 mmHg AV Area (VTI):     1.99 cm AV Vmax:           172.00 cm/s AV Vmean:          115.000 cm/s AV VTI:            0.342 m AV Peak Grad:      11.8 mmHg AV Mean Grad:      6.0 mmHg LVOT Vmax:         99.90 cm/s LVOT Vmean:        63.000 cm/s LVOT VTI:          0.179 m LVOT/AV VTI ratio: 0.52  AORTA Ao Root diam: 3.70 cm Ao Asc diam:  3.90 cm MITRAL VALVE               TRICUSPID VALVE MV Area (PHT): 3.51 cm    TR Peak grad:   10.1 mmHg MV Area VTI:   2.55 cm    TR Vmax:        159.00 cm/s MV Peak grad:  2.8 mmHg MV Mean grad:  1.0 mmHg    SHUNTS MV Vmax:       0.83 m/s    Systemic VTI:  0.18 m MV Vmean:      57.4 cm/s   Systemic Diam: 2.20 cm MV Decel Time: 216 msec MV E velocity: 84.40 cm/s MV A velocity: 83.60 cm/s MV E/A ratio:  1.01 Fransico Him MD Electronically signed by Fransico Him MD Signature Date/Time: 04/29/2021/12:18:58 PM  Final     ASSESSMENT & PLAN: 71 yo male with   Acute DVT and PE -Pt developed R leg swelling and was found to have acute extensive R leg DVT and at least submassive PE in all 5 lobes. No increased dyspnea or hypoxia. -no personal or family history of DVT/PE. Hypercoagulable work up was negative. -Risk factors include obesity and sedentary lifestyle from chronic back pain, and recent COVID-19 infection. Testosterone is a lower risk factor likely did not contribute much -He was treated with heparin in the hospital and transitioned to Eliquis, tolerating well without bleeding.  -patient seen with Dr. Burr Medico. We recommend to  repeat doppler in 3 months to see if DVT has improved/resolved. He anticipates having back surgery by neurosurgeon Dr. Sherley Bounds in 3 months. If doppler shows improvement and he continues tolerating eliquis, we recommend to proceed with surgery with lovenox bridge. Pt would greatly like to avoid IVC filter if possible  -If mobility improves and he is able to be active, he may be able to stop anticicoagulation after total 6 months.  -if he remains sedentary, we would recommend lifelong anticoagulation  -He is OK to continue testosterone as long as he is on eliquis.  -We offered to see Mr. Louque in 3 and 6 months to re-assess [facility] duration, he did not want to schedule a follow up at this time.  -F/up open  COVID-19 infection sept/October 2022 -Pt developed mild symptoms 4-6 weeks before DVT/PE, he tested positive on 2 home tests.  -Despite mild symptoms he completed Paxlovid due to his co-morbidities -He recovered well -this likely strongly contributed to DVT/PE   Spinal stenosis, back pain, sedentary lifestyle  -Pending L2-5 laminectomy by Dr. Sherley Bounds, postponed at least 3 months from DVT/PE diagnosis  -we discussed perioperative AC. If repeat doppler in 3 months shows improvement/resolution, Dr. Burr Medico recommends lovenox bridge to avoid IVC filter if Dr. Ronnald Ramp agrees.  -We have asked Dr. Ronnald Ramp to call Dr. Burr Medico to discuss.   Hypogonadism on testosterone, asthma, DM, other co-morbidities   -OK to continue as long as patient continues Eliquis.  -continue f/up and med regimen per PCP Dr. Moreen Fowler    PLAN: -Continue Eliquis -Recommend to repeat R leg doppler in 3 months to assess improvement/resolution -If doppler shows improvement/resolution, ok to proceed with back surgery with lovenox bridge if Dr. Ronnald Ramp agrees -If back pain improves mobility, ok to stop eliquis after total 6 months  -If mobility does not improve, we recommend life-long anticoagulation  -F/up open    All questions were  answered. The patient knows to call the clinic with any problems, questions or concerns.   Cira Rue, NP 05/18/2021       Truitt Merle, MD 05/17/21 9:33 PM

## 2021-05-16 NOTE — Progress Notes (Addendum)
[name]   [phone] Fax:([phone]) [place] consult Note   Patient Care Team: Antony Contras, MD as PCP - General (Family Medicine) Sueanne Margarita, MD as PCP - Sleep Medicine (Sleep Medicine) Date of Service: 05/17/2021  CHIEF COMPLAINTS/PURPOSE OF CONSULTATION:  DVT/PE, referred by PCP Dr. Antony Contras  HISTORY OF PRESENTING ILLNESS:  Eric Campbell 70 y.o. male with PMH including HTN, OSA on CPAP, asthma, DM, HL, hypogonadism on topical testosterone x10-15 years, radiculopathy, spinal stenosis with sedentary lifestyle, and osteopenia here because of newly diagnosed DVT/PE. He developed painful leg swelling 04/2021, PCP ordered doppler 04/28/21 which was positive for RLE acute DVT extending from the posterior tibial and peroneal calf veins through the popliteal and femoral veins. He was sent to ED for further eval/work up including CTA 04/28/21 which showed segmental and subsegmental pulmonary emboli in all 5 lobes and developing right heart strain c/w at least submassive PE, intermediate risk. There were 2 small 1 cm right lung nodules that were found incidentally. He was admitted for management. Echo showed LVEF 60-65% and no RV heart strain. Prior to [facility], hypercoagulable panel was drawn which showed normal Antithrombin III, protein S and C activity, lupus anticoagulant, beta-2 glycoprotein and cardiolipin antibodies, homocysteine, factor 5, and prothrombin gene mutation overall negative. He was initiated on heparin and transitioned to eliquis at discharge on 04/30/21. He denies history of recent travel, injury, surgery, or malignancy. 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 He also notes 4-6 weeks ago he tested positive for covid, took paxlovid, then developed uncontrollable hyperglycemia up to 475 and rapid 25 lbs weight loss. He eventually recovered.   Socially, he is married with 3 adult children.  He works in Psychiatrist.  He is independent with ADLs.  Drinks alcohol sparingly on special occasions, denies tobacco or other drug use.  He has had a colonoscopy, unsure if he has had PSA checked.  His brother had cancer of unknown type, may be testicular or lung.  Today he presents by himself, ambulates with cane.  Right leg still swollen but improved.  He has chronic back pain but none when sitting.  Also has chronic exertional dyspnea for many years which is stable.  He did not note any cough, chest pain, or increased dyspnea at the time of DVT/PE. Takes Symbicort for adult asthma.  He is tolerating Eliquis without bruising or bleeding.    MEDICAL HISTORY:  Past Medical History:  Diagnosis Date   Allergic rhinitis    Allergy    Asthma    BPH (benign prostatic hyperplasia)    Colon polyps    Congenital hearing loss    Diabetes mellitus    diet controlled   Dyslipidemia    ED (erectile dysfunction)    Hyperlipidemia    Hypertension    Hypogonadism male    Obesity    Osteopenia    Seasonal allergies    Sleep apnea     wears cpap   Torn ligament    left ankle   Vitamin D deficiency     SURGICAL HISTORY: Past Surgical History:  Procedure Laterality Date   ACHILLES TENDON SURGERY Left    ankle   COLONOSCOPY     POLYPECTOMY     UMBILICAL HERNIA REPAIR  1998    SOCIAL HISTORY: Social History   Socioeconomic History   Marital status: Married    Spouse name: donna  Number of children: 3   Years of education: college   Highest education level: Not on file  Occupational History   Occupation: [employer]/retired  Tobacco Use   Smoking status: Never   Smokeless tobacco: Never  Vaping Use   Vaping Use: Never used  Substance and Sexual Activity   Alcohol use: Not Currently    Comment: at events like weddings   Drug use: No   Sexual activity: Not on file  Other Topics Concern   Not on file  Social History Narrative   Tobacco use cigarettes:  Never smoked. Tobacco history last upated 10/27/2013 no smoking Alcohol : Rare>> No Recreational drug use.   Social Determinants of Health   Financial Resource Strain: Not on file  Food Insecurity: Not on file  Transportation Needs: Not on file  Physical Activity: Not on file  Stress: Not on file  Social Connections: Not on file  Intimate Partner Violence: Not on file    FAMILY HISTORY: Family History  Problem Relation Age of Onset   Pancreatic cancer Mother 53   COPD Mother    Emphysema Mother    Other Mother        had Colostomy-    COPD Father    Heart attack Father    Emphysema Father    Ulcerative colitis Grandchild 90   Diabetes Sister    Cancer Brother 41       type unknown   Colon cancer Neg Hx    Stomach cancer Neg Hx    Rectal cancer Neg Hx    Colon polyps Neg Hx    Esophageal cancer Neg Hx     ALLERGIES:  is allergic to lotrisone [clotrimazole-betamethasone], penicillins, and cephalexin.  MEDICATIONS:  Current Outpatient Medications  Medication Sig Dispense Refill   albuterol (PROVENTIL HFA;VENTOLIN HFA) 108 (90 BASE) MCG/ACT inhaler Inhale 2 puffs into the lungs every 4 (four) hours as needed for wheezing or shortness of breath.     alclomethasone (ACLOVATE) 0.05 % cream Apply 1 application topically 2 (two) times daily.     [START ON 05/30/2021] apixaban (ELIQUIS) 5 MG TABS tablet Take 1 tablet (5 mg total) by mouth 2 (two) times daily. Start this once you finish starter pack 60 tablet 1   APIXABAN (ELIQUIS) VTE STARTER PACK (10MG  AND 5MG ) Take as directed on package: start with two-5mg  tablets twice daily for 7 days. On day 8, switch to one-5mg  tablet twice daily. 1 each 0   atorvastatin (LIPITOR) 20 MG tablet Take 20 mg by mouth daily.      augmented betamethasone dipropionate (DIPROLENE-AF) 0.05 % cream Apply 1 application topically in the morning and at bedtime.     B Complex-Folic Acid (B COMPLEX-VITAMIN B12 PO) Take 2-3 tablets by mouth daily.      budesonide-formoterol (SYMBICORT) 80-4.5 MCG/ACT inhaler 2 puffs in the morning right when you wake up, rinse out your mouth after use, 12 hours later 2 puffs, rinse after use.Take this daily, no matter what.This is not a rescue inhaler 1 each 12   Calcium Carbonate 1500 (600 CA) MG TABS Take 1 tablet by mouth daily.      Continuous Blood Gluc Sensor (FREESTYLE LIBRE 14 DAY SENSOR) MISC as directed every 2 weeks     Continuous Blood Gluc Sensor (FREESTYLE LIBRE 14 DAY SENSOR) MISC Apply topically every 14 (fourteen) days.     losartan (COZAAR) 100 MG tablet Take 100 mg by mouth daily.     metFORMIN (GLUCOPHAGE)  1000 MG tablet Take 1,000 mg by mouth 2 (two) times daily.     Omega-3 Fatty Acids (FISH OIL) 1000 MG CAPS Take 1 capsule by mouth daily.     OneTouch Delica Lancets 12X MISC TEST BLOOD GLUCOSE ONCE DAILY/ DX E11.65     SHINGRIX injection      Testosterone 20.25 MG/ACT (1.62%) GEL Apply 2 Pump topically daily.     triamcinolone cream (KENALOG) 0.1 % Apply 1 application topically daily as needed for rash.     Vitamin D, Ergocalciferol, 2000 units CAPS Take 2 capsules by mouth 2 (two) times daily.     No current facility-administered medications for this visit.    REVIEW OF SYSTEMS:   Constitutional: Denies fevers, chills, abnormal night sweats (+) unintentional weight loss with COVID 1 month ago Eyes: Denies blurriness of vision, double vision or watery eyes Ears, nose, mouth, throat, and face: Denies mucositis or sore throat Respiratory: Denies cough (+)  wheezes (+) asthma (+) chronic dyspnea Cardiovascular: Denies palpitation, chest discomfort (+) right greater than left lower extremity swelling Gastrointestinal:  Denies nausea, heartburn or change in bowel habits Skin: Denies abnormal skin rashes Lymphatics: Denies new lymphadenopathy or easy bruising Neurological:Denies numbness, tingling or new weaknesses (+) spinal stenosis Behavioral/Psych: Mood is stable, no new changes  All  other systems were reviewed with the patient and are negative.  PHYSICAL EXAMINATION: ECOG PERFORMANCE STATUS: 1 - Symptomatic but completely ambulatory  Vitals:   05/17/21 1101  BP: (!) 143/87  Pulse: 81  Resp: 20  Temp: 97.9 F (36.6 C)  SpO2: 96%   Filed Weights   05/17/21 1101  Weight: 253 lb 9.6 oz (115 kg)    GENERAL:alert, no distress and comfortable SKIN: skin eruptions on the lower legs EYES: sclera clear LUNGS:  normal breathing effort HEART: R>L lower extremity edema ABDOMEN:abdomen round Musculoskeletal:no cyanosis of digits and no clubbing  PSYCH: alert & oriented x 3 with fluent speech  LABORATORY DATA:  I have reviewed the data as listed CBC Latest Ref Rng & Units 04/30/2021 04/29/2021 04/28/2021  WBC 4.0 - 10.5 K/uL 7.3 7.8 7.5  Hemoglobin 13.0 - 17.0 g/dL 13.2 13.8 14.4  Hematocrit 39.0 - 52.0 % 37.5(L) 38.1(L) 40.8  Platelets 150 - 400 K/uL 221 222 218    CMP Latest Ref Rng & Units 04/30/2021 04/28/2021  Glucose 70 - 99 mg/dL 130(H) 101(H)  BUN 8 - 23 mg/dL 7(L) 11  Creatinine 0.61 - 1.24 mg/dL 0.98 0.97  Sodium 135 - 145 mmol/L 138 140  Potassium 3.5 - 5.1 mmol/L 3.8 3.9  Chloride 98 - 111 mmol/L 107 106  CO2 22 - 32 mmol/L 25 27  Calcium 8.9 - 10.3 mg/dL 8.8(L) 9.5     RADIOGRAPHIC STUDIES: I have personally reviewed the radiological images as listed and agreed with the findings in the report. CT Angio Chest PE W and/or Wo Contrast  Result Date: 04/28/2021 CLINICAL DATA:  PE suspected, high prob. tolerated w/o complication or reaction Pt arrives to ED with c/o right right leg x1 week. Pt reports he had an outpatient [place] that showed an acute DVT in the right leg. EXAM: CT ANGIOGRAPHY CHEST WITH CONTRAST TECHNIQUE: Multidetector CT imaging of the chest was performed using the standard protocol during bolus administration of intravenous contrast. Multiplanar CT image reconstructions and MIPs were obtained to evaluate the vascular anatomy.  CONTRAST:  41mL OMNIPAQUE IOHEXOL 350 MG/ML SOLN COMPARISON:  cxr12/2/21. FINDINGS: Cardiovascular: Satisfactory opacification of the pulmonary arteries to the  segmental level. Segmental and subsegmental pulmonary emboli of all 5 lobes. Enlarged right to left ventricular ratio of 1.1. Straightening of the interventricular septum. Otherwise normal heart size. No pericardial effusion. Mild atherosclerotic plaque. Four-vessel coronary calcifications. Mediastinum/Nodes: No enlarged mediastinal, hilar, or axillary lymph nodes. Thyroid gland, trachea, and esophagus demonstrate no significant findings. Lungs/Pleura: There is a 1 x 0.7 cm subpleural nodule within the right upper lobe. There is a 1.2 x 0.9 cm subpleural right lower lobe pulmonary (6:104). No focal consolidation. No pulmonary nodule. No pulmonary mass. No pleural effusion. No pneumothorax. Upper Abdomen: No acute abnormality. Musculoskeletal: No chest wall abnormality. No suspicious lytic or blastic osseous lesions. No acute displaced fracture. Multilevel degenerative changes of the spine. Review of the MIP images confirms the above findings. IMPRESSION: 1. Segmental and subsegmental pulmonary emboli of all 5 lobes. Associated developing right heart strain. No pulmonary infarction. Positive for acute PE with CT evidence of right heart strain (RV/LV Ratio = 1.1) consistent with at least submassive (intermediate risk) PE. The presence of right heart strain has been associated with an increased risk of morbidity and mortality. Please refer to the "PE Focused" order set in EPIC. 2. Couple of subcentimeter subpleural nodule within the right lung. Non-contrast chest CT at 3-6 months is recommended. If the nodules are stable at time of repeat CT, then future CT at 18-24 months (from today's scan) is considered optional for low-risk patients, but is recommended for high-risk patients. This recommendation follows the consensus statement: Guidelines for Management of  Incidental Pulmonary Nodules Detected on CT Images: From the Fleischner Society 2017; Radiology 2017; 284:228-243. 3.  Aortic Atherosclerosis (ICD10-I70.0). These results were called by telephone at the time of interpretation on 04/28/2021 at 7:00 pm to provider [facility] , who verbally acknowledged these results. Electronically Signed   By: Iven Finn M.D.   On: 04/28/2021 19:05   US Venous Img Lower Unilateral Right (DVT)  Result Date: 04/28/2021 CLINICAL DATA:  Edema, pain EXAM: RIGHT LOWER EXTREMITY VENOUS DOPPLER ULTRASOUND TECHNIQUE: Gray-scale sonography with compression, as well as color and duplex ultrasound, were performed to evaluate the deep venous system(s) from the level of the common femoral vein through the popliteal and proximal calf veins. COMPARISON:  None. FINDINGS: VENOUS Hypoechoic heterogenous intraluminal thrombus in the common femoral vein. The saphenofemoral junction remains patent. There is hypoechoic occlusive thrombus in the deep femoral vein. Hypoechoic non compressible thrombus through the femoral and popliteal veins with limited flow signal on color Doppler. Occlusive thrombus in posterior tibial and peroneal veins in the calf. Antegrade color flow signal noted in the visualized portion of the right external iliac vein. Limited views of the contralateral common femoral vein are unremarkable. OTHER None. Limitations: none IMPRESSION: 1. POSITIVE for right lower extremity acute DVT extending from posterior tibial and peroneal calf veins through the popliteal and femoral veins as above. Electronically Signed   By: Lucrezia Europe M.D.   On: 04/28/2021 13:53   ECHOCARDIOGRAM COMPLETE  Result Date: 04/29/2021    ECHOCARDIOGRAM REPORT   Patient Name:   Eric Campbell Date of Exam: 04/29/2021 Medical Rec #:  [id number]       Height:       69.0 in Accession #:    [id number]      Weight:       247.6 lb Date of Birth:  1951-06-26       BSA:          2.262 m Patient Age:  70  years        BP:           120/77 mmHg Patient Gender: M               HR:           83 bpm. Exam Location:  Inpatient Procedure: 2D Echo, Cardiac Doppler and Color Doppler Indications:    Pulmonary Embolus  History:        Patient has prior history of Echocardiogram examinations, most                 recent 09/26/2007. Risk Factors:Hypertension and Dyslipidemia.                 DVT.  Sonographer:    Wenda Low Referring Phys: [id number] [place]  1. Left ventricular ejection fraction, by estimation, is 60 to 65%. The left ventricle has normal function. The left ventricle has no regional wall motion abnormalities. There is severe concentric left ventricular hypertrophy. Left ventricular diastolic  parameters were normal.  2. Right ventricular systolic function is normal. The right ventricular size is normal.  3. Right atrial size was mildly dilated.  4. The mitral valve is normal in structure. No evidence of mitral valve regurgitation. No evidence of mitral stenosis.  5. The aortic valve is calcified. Aortic valve regurgitation is not visualized. Mild to moderate aortic valve sclerosis/calcification is present, without any evidence of aortic stenosis. Aortic valve area, by VTI measures 1.99 cm. Aortic valve mean gradient measures 6.0 mmHg. Aortic valve Vmax measures 1.72 m/s.  6. Aortic dilatation noted. There is borderline dilatation of the aortic root, measuring 37 mm. There is mild dilatation of the ascending aorta, measuring 39 mm. FINDINGS  Left Ventricle: Left ventricular ejection fraction, by estimation, is 60 to 65%. The left ventricle has normal function. The left ventricle has no regional wall motion abnormalities. The left ventricular internal cavity size was normal in size. There is  severe concentric left ventricular hypertrophy. Left ventricular diastolic parameters were normal. Normal left ventricular filling pressure. Right Ventricle: The right ventricular size is normal. No  increase in right ventricular wall thickness. Right ventricular systolic function is normal. Left Atrium: Left atrial size was normal in size. Right Atrium: Right atrial size was mildly dilated. Pericardium: There is no evidence of pericardial effusion. Mitral Valve: The mitral valve is normal in structure. No evidence of mitral valve regurgitation. No evidence of mitral valve stenosis. MV peak gradient, 2.8 mmHg. The mean mitral valve gradient is 1.0 mmHg. Tricuspid Valve: The tricuspid valve is normal in structure. Tricuspid valve regurgitation is trivial. No evidence of tricuspid stenosis. Aortic Valve: The aortic valve is calcified. Aortic valve regurgitation is not visualized. Mild to moderate aortic valve sclerosis/calcification is present, without any evidence of aortic stenosis. Aortic valve mean gradient measures 6.0 mmHg. Aortic valve peak gradient measures 11.8 mmHg. Aortic valve area, by VTI measures 1.99 cm. Pulmonic Valve: The pulmonic valve was normal in structure. Pulmonic valve regurgitation is trivial. No evidence of pulmonic stenosis. Aorta: Aortic dilatation noted. There is borderline dilatation of the aortic root, measuring 37 mm. There is mild dilatation of the ascending aorta, measuring 39 mm. Venous: The inferior vena cava was not well visualized. IAS/Shunts: No atrial level shunt detected by color flow Doppler.  LEFT VENTRICLE PLAX 2D LVIDd:         3.70 cm     Diastology LVIDs:         2.50  cm     LV e' medial:    8.05 cm/s LV PW:         1.60 cm     LV E/e' medial:  10.5 LV IVS:        1.70 cm     LV e' lateral:   7.51 cm/s LVOT diam:     2.20 cm     LV E/e' lateral: 11.2 LV SV:         68 LV SV Index:   30 LVOT Area:     3.80 cm  LV Volumes (MOD) LV vol d, MOD A2C: 71.5 ml LV vol d, MOD A4C: 76.9 ml LV vol s, MOD A2C: 32.1 ml LV vol s, MOD A4C: 33.8 ml LV SV MOD A2C:     39.4 ml LV SV MOD A4C:     76.9 ml LV SV MOD BP:      43.1 ml RIGHT VENTRICLE RV Basal diam:  3.60 cm RV Mid diam:     2.80 cm TAPSE (M-mode): 3.0 cm LEFT ATRIUM             Index        RIGHT ATRIUM           Index LA diam:        4.10 cm 1.81 cm/m   RA Area:     24.10 cm LA Vol (A2C):   53.5 ml 23.65 ml/m  RA Volume:   77.90 ml  34.43 ml/m LA Vol (A4C):   57.2 ml 25.28 ml/m LA Biplane Vol: 56.9 ml 25.15 ml/m  AORTIC VALVE                     PULMONIC VALVE AV Area (Vmax):    2.21 cm      PV Vmax:       0.76 m/s AV Area (Vmean):   2.08 cm      PV Peak grad:  2.3 mmHg AV Area (VTI):     1.99 cm AV Vmax:           172.00 cm/s AV Vmean:          115.000 cm/s AV VTI:            0.342 m AV Peak Grad:      11.8 mmHg AV Mean Grad:      6.0 mmHg LVOT Vmax:         99.90 cm/s LVOT Vmean:        63.000 cm/s LVOT VTI:          0.179 m LVOT/AV VTI ratio: 0.52  AORTA Ao Root diam: 3.70 cm Ao Asc diam:  3.90 cm MITRAL VALVE               TRICUSPID VALVE MV Area (PHT): 3.51 cm    TR Peak grad:   10.1 mmHg MV Area VTI:   2.55 cm    TR Vmax:        159.00 cm/s MV Peak grad:  2.8 mmHg MV Mean grad:  1.0 mmHg    SHUNTS MV Vmax:       0.83 m/s    Systemic VTI:  0.18 m MV Vmean:      57.4 cm/s   Systemic Diam: 2.20 cm MV Decel Time: 216 msec MV E velocity: 84.40 cm/s MV A velocity: 83.60 cm/s MV E/A ratio:  1.01 Fransico Him MD Electronically signed by Fransico Him MD Signature Date/Time: 04/29/2021/12:18:58 PM  Final     ASSESSMENT & PLAN: 70 yo male with   Acute DVT and PE -Pt developed R leg swelling and was found to have acute extensive R leg DVT and at least submassive PE in all 5 lobes. No increased dyspnea or hypoxia. -no personal or family history of DVT/PE. Hypercoagulable work up was negative. -Risk factors include obesity and sedentary lifestyle from chronic back pain, and recent COVID-19 infection. Testosterone is a lower risk factor likely did not contribute much -He was treated with heparin in the hospital and transitioned to Eliquis, tolerating well without bleeding.  -patient seen with Dr. Burr Medico. We recommend to  repeat doppler in 3 months to see if DVT has improved/resolved. He anticipates having back surgery by neurosurgeon Dr. Sherley Bounds in 3 months. If doppler shows improvement and he continues tolerating eliquis, we recommend to proceed with surgery with lovenox bridge. Pt would greatly like to avoid IVC filter if possible  -If mobility improves and he is able to be active, he may be able to stop anticicoagulation after total 6 months.  -if he remains sedentary, we would recommend lifelong anticoagulation  -He is OK to continue testosterone as long as he is on eliquis.  -We offered to see Mr. Peine in 3 and 6 months to re-assess [facility] duration, he did not want to schedule a follow up at this time.  -F/up open  COVID-19 infection sept/October 2022 -Pt developed mild symptoms 4-6 weeks before DVT/PE, he tested positive on 2 home tests.  -Despite mild symptoms he completed Paxlovid due to his co-morbidities -He recovered well -this likely strongly contributed to DVT/PE   Spinal stenosis, back pain, sedentary lifestyle  -Pending L2-5 laminectomy by Dr. Sherley Bounds, postponed at least 3 months from DVT/PE diagnosis  -we discussed perioperative AC. If repeat doppler in 3 months shows improvement/resolution, Dr. Burr Medico recommends lovenox bridge to avoid IVC filter if Dr. Ronnald Ramp agrees.  -We have asked Dr. Ronnald Ramp to call Dr. Burr Medico to discuss.   Hypogonadism on testosterone, asthma, DM, other co-morbidities   -OK to continue as long as patient continues Eliquis.  -continue f/up and med regimen per PCP Dr. Moreen Fowler    PLAN: -Continue Eliquis -Recommend to repeat R leg doppler in 3 months to assess improvement/resolution -If doppler shows improvement/resolution, ok to proceed with back surgery with lovenox bridge if Dr. Ronnald Ramp agrees -If back pain improves mobility, ok to stop eliquis after total 6 months  -If mobility does not improve, we recommend life-long anticoagulation  -F/up open    All questions were  answered. The patient knows to call the clinic with any problems, questions or concerns.   Cira Rue, NP 05/18/2021   Addendum  70 yo male with PMH of DM, spinal stenosis, presented with unprovoked extensive DVT and PE.  I recommended lifelong anticoagulation if no severe bleeding or other contraindications in the future.  Patient is tolerating Eliquis well, will continue.  Patient needs back surgery, but adamant about not having IVC filter before surgery. I spoke with his neurosurgeon Dr. Ronnald Ramp after his clinic visit with [place], he does not feel comfortable to proceed to surgery without IVC filter because patient cannot restart anticoagulation until 7 days after surgery. I will inform pt. I will see him as needed in future.     Truitt Merle, MD 05/17/21

## 2021-05-17 ENCOUNTER — Other Ambulatory Visit: Payer: Self-pay

## 2021-05-17 ENCOUNTER — Inpatient Hospital Stay: Payer: PPO | Attending: Nurse Practitioner | Admitting: Nurse Practitioner

## 2021-05-17 VITALS — BP 143/87 | HR 81 | Temp 97.9°F | Resp 20 | Ht 69.0 in | Wt 253.6 lb

## 2021-05-17 DIAGNOSIS — G8929 Other chronic pain: Secondary | ICD-10-CM | POA: Insufficient documentation

## 2021-05-17 DIAGNOSIS — G4733 Obstructive sleep apnea (adult) (pediatric): Secondary | ICD-10-CM | POA: Diagnosis not present

## 2021-05-17 DIAGNOSIS — I82441 Acute embolism and thrombosis of right tibial vein: Secondary | ICD-10-CM | POA: Diagnosis not present

## 2021-05-17 DIAGNOSIS — E291 Testicular hypofunction: Secondary | ICD-10-CM | POA: Diagnosis not present

## 2021-05-17 DIAGNOSIS — M8580 Other specified disorders of bone density and structure, unspecified site: Secondary | ICD-10-CM | POA: Diagnosis not present

## 2021-05-17 DIAGNOSIS — Z8616 Personal history of COVID-19: Secondary | ICD-10-CM | POA: Diagnosis not present

## 2021-05-17 DIAGNOSIS — Z7901 Long term (current) use of anticoagulants: Secondary | ICD-10-CM | POA: Diagnosis not present

## 2021-05-17 DIAGNOSIS — Z7984 Long term (current) use of oral hypoglycemic drugs: Secondary | ICD-10-CM | POA: Insufficient documentation

## 2021-05-17 DIAGNOSIS — I2693 Single subsegmental pulmonary embolism without acute cor pulmonale: Secondary | ICD-10-CM | POA: Diagnosis not present

## 2021-05-17 DIAGNOSIS — I824Y1 Acute embolism and thrombosis of unspecified deep veins of right proximal lower extremity: Secondary | ICD-10-CM

## 2021-05-17 DIAGNOSIS — E119 Type 2 diabetes mellitus without complications: Secondary | ICD-10-CM | POA: Insufficient documentation

## 2021-05-17 DIAGNOSIS — I82411 Acute embolism and thrombosis of right femoral vein: Secondary | ICD-10-CM | POA: Diagnosis not present

## 2021-05-17 DIAGNOSIS — I2699 Other pulmonary embolism without acute cor pulmonale: Secondary | ICD-10-CM | POA: Diagnosis not present

## 2021-05-17 DIAGNOSIS — I82431 Acute embolism and thrombosis of right popliteal vein: Secondary | ICD-10-CM | POA: Insufficient documentation

## 2021-05-17 DIAGNOSIS — I82451 Acute embolism and thrombosis of right peroneal vein: Secondary | ICD-10-CM | POA: Diagnosis not present

## 2021-05-17 DIAGNOSIS — M4800 Spinal stenosis, site unspecified: Secondary | ICD-10-CM

## 2021-05-17 DIAGNOSIS — R918 Other nonspecific abnormal finding of lung field: Secondary | ICD-10-CM | POA: Diagnosis not present

## 2021-05-18 ENCOUNTER — Encounter: Payer: Self-pay | Admitting: Nurse Practitioner

## 2021-05-18 DIAGNOSIS — M48062 Spinal stenosis, lumbar region with neurogenic claudication: Secondary | ICD-10-CM | POA: Diagnosis not present

## 2021-05-18 DIAGNOSIS — I2699 Other pulmonary embolism without acute cor pulmonale: Secondary | ICD-10-CM | POA: Diagnosis not present

## 2021-05-18 DIAGNOSIS — M546 Pain in thoracic spine: Secondary | ICD-10-CM | POA: Diagnosis not present

## 2021-05-24 DIAGNOSIS — R790 Abnormal level of blood mineral: Secondary | ICD-10-CM | POA: Diagnosis not present

## 2021-05-30 ENCOUNTER — Telehealth: Payer: Self-pay

## 2021-05-30 NOTE — Telephone Encounter (Signed)
This nurse reached out to Dr. Ronnald Ramp office and request to have him to give MD a return call.  No further questions or concerns at this time.

## 2021-05-31 DIAGNOSIS — H52201 Unspecified astigmatism, right eye: Secondary | ICD-10-CM | POA: Diagnosis not present

## 2021-05-31 DIAGNOSIS — H5203 Hypermetropia, bilateral: Secondary | ICD-10-CM | POA: Diagnosis not present

## 2021-05-31 DIAGNOSIS — E119 Type 2 diabetes mellitus without complications: Secondary | ICD-10-CM | POA: Diagnosis not present

## 2021-05-31 DIAGNOSIS — H11152 Pinguecula, left eye: Secondary | ICD-10-CM | POA: Diagnosis not present

## 2021-05-31 DIAGNOSIS — H0259 Other disorders affecting eyelid function: Secondary | ICD-10-CM | POA: Diagnosis not present

## 2021-05-31 DIAGNOSIS — H2513 Age-related nuclear cataract, bilateral: Secondary | ICD-10-CM | POA: Diagnosis not present

## 2021-05-31 DIAGNOSIS — Z7984 Long term (current) use of oral hypoglycemic drugs: Secondary | ICD-10-CM | POA: Diagnosis not present

## 2021-05-31 DIAGNOSIS — H524 Presbyopia: Secondary | ICD-10-CM | POA: Diagnosis not present

## 2021-06-02 ENCOUNTER — Ambulatory Visit: Admit: 2021-06-02 | Payer: PPO | Admitting: Neurological Surgery

## 2021-06-02 SURGERY — LUMBAR LAMINECTOMY/DECOMPRESSION MICRODISCECTOMY 3 LEVELS
Anesthesia: General | Site: Back

## 2021-06-05 ENCOUNTER — Telehealth: Payer: Self-pay

## 2021-06-05 NOTE — Telephone Encounter (Signed)
Pt called per request from Cira Rue, NP, pt made aware of the recommendation form Cira Rue, NP and Dr.Feng to continue with the [facility] neurosurgeon refferal. Pt agreeable and had no further questions at this time.

## 2021-06-20 DIAGNOSIS — I10 Essential (primary) hypertension: Secondary | ICD-10-CM | POA: Diagnosis not present

## 2021-06-20 DIAGNOSIS — J45909 Unspecified asthma, uncomplicated: Secondary | ICD-10-CM | POA: Diagnosis not present

## 2021-06-20 DIAGNOSIS — E782 Mixed hyperlipidemia: Secondary | ICD-10-CM | POA: Diagnosis not present

## 2021-06-20 DIAGNOSIS — G4700 Insomnia, unspecified: Secondary | ICD-10-CM | POA: Diagnosis not present

## 2021-06-20 DIAGNOSIS — N400 Enlarged prostate without lower urinary tract symptoms: Secondary | ICD-10-CM | POA: Diagnosis not present

## 2021-06-20 DIAGNOSIS — E1165 Type 2 diabetes mellitus with hyperglycemia: Secondary | ICD-10-CM | POA: Diagnosis not present

## 2021-06-20 DIAGNOSIS — E1169 Type 2 diabetes mellitus with other specified complication: Secondary | ICD-10-CM | POA: Diagnosis not present

## 2021-06-20 DIAGNOSIS — M8580 Other specified disorders of bone density and structure, unspecified site: Secondary | ICD-10-CM | POA: Diagnosis not present

## 2021-07-13 DIAGNOSIS — J45909 Unspecified asthma, uncomplicated: Secondary | ICD-10-CM | POA: Diagnosis not present

## 2021-07-13 DIAGNOSIS — G4700 Insomnia, unspecified: Secondary | ICD-10-CM | POA: Diagnosis not present

## 2021-07-13 DIAGNOSIS — N400 Enlarged prostate without lower urinary tract symptoms: Secondary | ICD-10-CM | POA: Diagnosis not present

## 2021-07-13 DIAGNOSIS — I10 Essential (primary) hypertension: Secondary | ICD-10-CM | POA: Diagnosis not present

## 2021-07-13 DIAGNOSIS — E1169 Type 2 diabetes mellitus with other specified complication: Secondary | ICD-10-CM | POA: Diagnosis not present

## 2021-07-13 DIAGNOSIS — M8580 Other specified disorders of bone density and structure, unspecified site: Secondary | ICD-10-CM | POA: Diagnosis not present

## 2021-07-13 DIAGNOSIS — E1165 Type 2 diabetes mellitus with hyperglycemia: Secondary | ICD-10-CM | POA: Diagnosis not present

## 2021-07-13 DIAGNOSIS — E782 Mixed hyperlipidemia: Secondary | ICD-10-CM | POA: Diagnosis not present

## 2021-07-27 ENCOUNTER — Other Ambulatory Visit: Payer: Self-pay

## 2021-07-27 ENCOUNTER — Other Ambulatory Visit (HOSPITAL_BASED_OUTPATIENT_CLINIC_OR_DEPARTMENT_OTHER): Payer: Self-pay | Admitting: Neurological Surgery

## 2021-07-27 ENCOUNTER — Ambulatory Visit (HOSPITAL_BASED_OUTPATIENT_CLINIC_OR_DEPARTMENT_OTHER)
Admission: RE | Admit: 2021-07-27 | Discharge: 2021-07-27 | Disposition: A | Discharge status: Home / Self Care | Payer: PPO | Source: Ambulatory Visit | Attending: Neurological Surgery | Admitting: Neurological Surgery

## 2021-07-27 DIAGNOSIS — I82401 Acute embolism and thrombosis of unspecified deep veins of right lower extremity: Secondary | ICD-10-CM | POA: Diagnosis not present

## 2021-07-27 DIAGNOSIS — I82411 Acute embolism and thrombosis of right femoral vein: Secondary | ICD-10-CM | POA: Diagnosis not present

## 2021-07-27 DIAGNOSIS — Z6837 Body mass index (BMI) 37.0-37.9, adult: Secondary | ICD-10-CM | POA: Diagnosis not present

## 2021-07-27 DIAGNOSIS — I82441 Acute embolism and thrombosis of right tibial vein: Secondary | ICD-10-CM | POA: Diagnosis not present

## 2021-07-27 DIAGNOSIS — I82409 Acute embolism and thrombosis of unspecified deep veins of unspecified lower extremity: Secondary | ICD-10-CM | POA: Diagnosis not present

## 2021-07-27 DIAGNOSIS — I10 Essential (primary) hypertension: Secondary | ICD-10-CM | POA: Diagnosis not present

## 2021-07-31 ENCOUNTER — Ambulatory Visit: Payer: PPO | Admitting: Podiatry

## 2021-07-31 ENCOUNTER — Other Ambulatory Visit: Payer: Self-pay

## 2021-07-31 ENCOUNTER — Encounter: Payer: Self-pay | Admitting: Podiatry

## 2021-07-31 DIAGNOSIS — E1169 Type 2 diabetes mellitus with other specified complication: Secondary | ICD-10-CM | POA: Insufficient documentation

## 2021-07-31 DIAGNOSIS — N400 Enlarged prostate without lower urinary tract symptoms: Secondary | ICD-10-CM | POA: Insufficient documentation

## 2021-07-31 DIAGNOSIS — B351 Tinea unguium: Secondary | ICD-10-CM

## 2021-07-31 DIAGNOSIS — R600 Localized edema: Secondary | ICD-10-CM

## 2021-07-31 DIAGNOSIS — E119 Type 2 diabetes mellitus without complications: Secondary | ICD-10-CM

## 2021-07-31 DIAGNOSIS — M79676 Pain in unspecified toe(s): Secondary | ICD-10-CM

## 2021-07-31 DIAGNOSIS — Z86718 Personal history of other venous thrombosis and embolism: Secondary | ICD-10-CM | POA: Insufficient documentation

## 2021-07-31 NOTE — Progress Notes (Signed)
This patient returns to my office for at risk foot care.  This patient requires this care by a professional since this patient will be at risk due to having type 2 diabetes and leg swelling.  This patient is unable to cut nails himself since the patient cannot reach his nails.These nails are painful walking and wearing shoes.  This patient presents for at risk foot care today.  General Appearance  Alert, conversant and in no acute stress.  Vascular  Dorsalis pedis and posterior tibial  pulses are palpable  left foot.  Dorsalis pedis and posterior tibial pulses are weakly palpable due to leg swelling..  Capillary return is within normal limits  bilaterally. Temperature is within normal limits  bilaterally.  Neurologic  Senn-Weinstein monofilament wire test within normal limits  bilaterally. Muscle power within normal limits bilaterally.  Nails Thick disfigured discolored nails with subungual debris  from hallux to fifth toes bilaterally. No evidence of bacterial infection or drainage bilaterally.  Orthopedic  No limitations of motion  feet .  No crepitus or effusions noted.  No bony pathology or digital deformities noted.  Skin  normotropic skin with no porokeratosis noted bilaterally.  No signs of infections or ulcers noted.     Onychomycosis  Pain in right toes  Pain in left toes  Consent was obtained for treatment procedures.   Mechanical debridement of nails 1-5  bilaterally performed with a nail nipper.  Filed with dremel without incident.     Return office visit   3 months                  Told patient to return for periodic foot care and evaluation due to potential at risk complications.   Gardiner Barefoot DPM

## 2021-08-11 DIAGNOSIS — M48062 Spinal stenosis, lumbar region with neurogenic claudication: Secondary | ICD-10-CM | POA: Diagnosis not present

## 2021-08-11 DIAGNOSIS — M546 Pain in thoracic spine: Secondary | ICD-10-CM | POA: Diagnosis not present

## 2021-08-11 DIAGNOSIS — E1169 Type 2 diabetes mellitus with other specified complication: Secondary | ICD-10-CM | POA: Diagnosis not present

## 2021-08-11 DIAGNOSIS — I10 Essential (primary) hypertension: Secondary | ICD-10-CM | POA: Diagnosis not present

## 2021-08-11 DIAGNOSIS — R790 Abnormal level of blood mineral: Secondary | ICD-10-CM | POA: Diagnosis not present

## 2021-08-11 DIAGNOSIS — E559 Vitamin D deficiency, unspecified: Secondary | ICD-10-CM | POA: Diagnosis not present

## 2021-08-11 DIAGNOSIS — I2699 Other pulmonary embolism without acute cor pulmonale: Secondary | ICD-10-CM | POA: Diagnosis not present

## 2021-08-11 DIAGNOSIS — E782 Mixed hyperlipidemia: Secondary | ICD-10-CM | POA: Diagnosis not present

## 2021-08-11 DIAGNOSIS — E291 Testicular hypofunction: Secondary | ICD-10-CM | POA: Diagnosis not present

## 2021-08-11 DIAGNOSIS — Z7984 Long term (current) use of oral hypoglycemic drugs: Secondary | ICD-10-CM | POA: Diagnosis not present

## 2021-08-11 DIAGNOSIS — Z6837 Body mass index (BMI) 37.0-37.9, adult: Secondary | ICD-10-CM | POA: Diagnosis not present

## 2021-09-04 DIAGNOSIS — M5451 Vertebrogenic low back pain: Secondary | ICD-10-CM | POA: Diagnosis not present

## 2021-09-05 ENCOUNTER — Other Ambulatory Visit: Payer: Self-pay | Admitting: Family Medicine

## 2021-09-05 DIAGNOSIS — R918 Other nonspecific abnormal finding of lung field: Secondary | ICD-10-CM

## 2021-09-14 DIAGNOSIS — M5451 Vertebrogenic low back pain: Secondary | ICD-10-CM | POA: Diagnosis not present

## 2021-09-20 DIAGNOSIS — M5451 Vertebrogenic low back pain: Secondary | ICD-10-CM | POA: Diagnosis not present

## 2021-09-26 NOTE — Telephone Encounter (Signed)
Unable to reach the patient

## 2021-09-29 ENCOUNTER — Ambulatory Visit
Admission: RE | Admit: 2021-09-29 | Discharge: 2021-09-29 | Disposition: A | Discharge status: Home / Self Care | Payer: PPO | Source: Ambulatory Visit | Attending: Family Medicine | Admitting: Family Medicine

## 2021-09-29 DIAGNOSIS — M5451 Vertebrogenic low back pain: Secondary | ICD-10-CM | POA: Diagnosis not present

## 2021-09-29 DIAGNOSIS — R918 Other nonspecific abnormal finding of lung field: Secondary | ICD-10-CM

## 2021-09-29 DIAGNOSIS — R911 Solitary pulmonary nodule: Secondary | ICD-10-CM | POA: Diagnosis not present

## 2021-10-04 DIAGNOSIS — M5451 Vertebrogenic low back pain: Secondary | ICD-10-CM | POA: Diagnosis not present

## 2021-10-10 DIAGNOSIS — E782 Mixed hyperlipidemia: Secondary | ICD-10-CM | POA: Diagnosis not present

## 2021-10-10 DIAGNOSIS — I10 Essential (primary) hypertension: Secondary | ICD-10-CM | POA: Diagnosis not present

## 2021-10-10 DIAGNOSIS — E1165 Type 2 diabetes mellitus with hyperglycemia: Secondary | ICD-10-CM | POA: Diagnosis not present

## 2021-10-11 DIAGNOSIS — M25511 Pain in right shoulder: Secondary | ICD-10-CM | POA: Diagnosis not present

## 2021-10-11 DIAGNOSIS — M5451 Vertebrogenic low back pain: Secondary | ICD-10-CM | POA: Diagnosis not present

## 2021-10-11 DIAGNOSIS — M7582 Other shoulder lesions, left shoulder: Secondary | ICD-10-CM | POA: Diagnosis not present

## 2021-10-11 DIAGNOSIS — M7581 Other shoulder lesions, right shoulder: Secondary | ICD-10-CM | POA: Diagnosis not present

## 2021-10-30 ENCOUNTER — Encounter: Payer: Self-pay | Admitting: Podiatry

## 2021-10-30 ENCOUNTER — Ambulatory Visit (INDEPENDENT_AMBULATORY_CARE_PROVIDER_SITE_OTHER): Payer: PPO | Admitting: Podiatry

## 2021-10-30 DIAGNOSIS — M79676 Pain in unspecified toe(s): Secondary | ICD-10-CM

## 2021-10-30 DIAGNOSIS — B351 Tinea unguium: Secondary | ICD-10-CM | POA: Diagnosis not present

## 2021-10-30 DIAGNOSIS — E119 Type 2 diabetes mellitus without complications: Secondary | ICD-10-CM | POA: Diagnosis not present

## 2021-10-30 NOTE — Progress Notes (Signed)
This patient returns to my office for at risk foot care.  This patient requires this care by a professional since this patient will be at risk due to having type 2 diabetes and leg swelling.  This patient is unable to cut nails himself since the patient cannot reach his nails.These nails are painful walking and wearing shoes.  This patient presents for at risk foot care today. ? ?General Appearance  Alert, conversant and in no acute stress. ? ?Vascular  Dorsalis pedis and posterior tibial  pulses are palpable  left foot.  Dorsalis pedis and posterior tibial pulses are weakly palpable due to leg swelling..  Capillary return is within normal limits  bilaterally. Temperature is within normal limits  bilaterally. ? ?Neurologic  Senn-Weinstein monofilament wire test within normal limits  bilaterally. Muscle power within normal limits bilaterally. ? ?Nails Thick disfigured discolored nails with subungual debris  from hallux to fifth toes bilaterally. No evidence of bacterial infection or drainage bilaterally. ? ?Orthopedic  No limitations of motion  feet .  No crepitus or effusions noted.  No bony pathology or digital deformities noted. ? ?Skin  normotropic skin with no porokeratosis noted bilaterally.  No signs of infections or ulcers noted.    ? ?Onychomycosis  Pain in right toes  Pain in left toes ? ?Consent was obtained for treatment procedures.   Mechanical debridement of nails 1-5  bilaterally performed with a nail nipper.  Filed with dremel without incident.   ? ? ?Return office visit   3 months                  Told patient to return for periodic foot care and evaluation due to potential at risk complications. ? ? ?Gardiner Barefoot DPM   ?

## 2021-11-21 DIAGNOSIS — E1165 Type 2 diabetes mellitus with hyperglycemia: Secondary | ICD-10-CM | POA: Diagnosis not present

## 2021-11-21 DIAGNOSIS — I10 Essential (primary) hypertension: Secondary | ICD-10-CM | POA: Diagnosis not present

## 2021-11-21 DIAGNOSIS — E782 Mixed hyperlipidemia: Secondary | ICD-10-CM | POA: Diagnosis not present

## 2021-11-21 DIAGNOSIS — J45909 Unspecified asthma, uncomplicated: Secondary | ICD-10-CM | POA: Diagnosis not present

## 2021-11-21 DIAGNOSIS — J309 Allergic rhinitis, unspecified: Secondary | ICD-10-CM | POA: Diagnosis not present

## 2022-01-19 DIAGNOSIS — I10 Essential (primary) hypertension: Secondary | ICD-10-CM | POA: Diagnosis not present

## 2022-01-19 DIAGNOSIS — E782 Mixed hyperlipidemia: Secondary | ICD-10-CM | POA: Diagnosis not present

## 2022-01-19 DIAGNOSIS — E1165 Type 2 diabetes mellitus with hyperglycemia: Secondary | ICD-10-CM | POA: Diagnosis not present

## 2022-01-31 ENCOUNTER — Encounter: Payer: Self-pay | Admitting: Podiatry

## 2022-01-31 ENCOUNTER — Ambulatory Visit: Payer: PPO | Admitting: Podiatry

## 2022-01-31 ENCOUNTER — Ambulatory Visit (INDEPENDENT_AMBULATORY_CARE_PROVIDER_SITE_OTHER): Payer: PPO | Admitting: Podiatry

## 2022-01-31 DIAGNOSIS — M79676 Pain in unspecified toe(s): Secondary | ICD-10-CM

## 2022-01-31 DIAGNOSIS — E119 Type 2 diabetes mellitus without complications: Secondary | ICD-10-CM

## 2022-01-31 DIAGNOSIS — B351 Tinea unguium: Secondary | ICD-10-CM | POA: Diagnosis not present

## 2022-01-31 NOTE — Progress Notes (Signed)
This patient returns to my office for at risk foot care.  This patient requires this care by a professional since this patient will be at risk due to having type 2 diabetes and leg swelling.  This patient is unable to cut nails himself since the patient cannot reach his nails.These nails are painful walking and wearing shoes.  This patient presents for at risk foot care today.  General Appearance  Alert, conversant and in no acute stress.  Vascular  Dorsalis pedis and posterior tibial  pulses are palpable  left foot.  Dorsalis pedis and posterior tibial pulses are weakly palpable due to leg swelling..  Capillary return is within normal limits  bilaterally. Temperature is within normal limits  bilaterally.  Neurologic  Senn-Weinstein monofilament wire test within normal limits  bilaterally. Muscle power within normal limits bilaterally.  Nails Thick disfigured discolored nails with subungual debris  from hallux to fifth toes bilaterally. No evidence of bacterial infection or drainage bilaterally.  Orthopedic  No limitations of motion  feet .  No crepitus or effusions noted.  No bony pathology or digital deformities noted.  DJD 1st MPJ  B/L.  Skin  normotropic skin with no porokeratosis noted bilaterally.  No signs of infections or ulcers noted.     Onychomycosis  Pain in right toes  Pain in left toes  Consent was obtained for treatment procedures.   Mechanical debridement of nails 1-5  bilaterally performed with a nail nipper.  Filed with dremel without incident.     Return office visit   3 months                  Told patient to return for periodic foot care and evaluation due to potential at risk complications.   Topacio Cella DPM   

## 2022-02-01 ENCOUNTER — Ambulatory Visit: Payer: PPO | Admitting: Podiatry

## 2022-02-10 DIAGNOSIS — G4733 Obstructive sleep apnea (adult) (pediatric): Secondary | ICD-10-CM | POA: Diagnosis not present

## 2022-02-21 DIAGNOSIS — L308 Other specified dermatitis: Secondary | ICD-10-CM | POA: Diagnosis not present

## 2022-02-21 DIAGNOSIS — L820 Inflamed seborrheic keratosis: Secondary | ICD-10-CM | POA: Diagnosis not present

## 2022-02-21 DIAGNOSIS — D485 Neoplasm of uncertain behavior of skin: Secondary | ICD-10-CM | POA: Diagnosis not present

## 2022-02-21 DIAGNOSIS — D2261 Melanocytic nevi of right upper limb, including shoulder: Secondary | ICD-10-CM | POA: Diagnosis not present

## 2022-02-21 DIAGNOSIS — S20461A Insect bite (nonvenomous) of right back wall of thorax, initial encounter: Secondary | ICD-10-CM | POA: Diagnosis not present

## 2022-02-21 DIAGNOSIS — B070 Plantar wart: Secondary | ICD-10-CM | POA: Diagnosis not present

## 2022-02-22 DIAGNOSIS — Z0000 Encounter for general adult medical examination without abnormal findings: Secondary | ICD-10-CM | POA: Diagnosis not present

## 2022-02-22 DIAGNOSIS — Z1331 Encounter for screening for depression: Secondary | ICD-10-CM | POA: Diagnosis not present

## 2022-02-22 DIAGNOSIS — M48062 Spinal stenosis, lumbar region with neurogenic claudication: Secondary | ICD-10-CM | POA: Diagnosis not present

## 2022-02-22 DIAGNOSIS — I2699 Other pulmonary embolism without acute cor pulmonale: Secondary | ICD-10-CM | POA: Diagnosis not present

## 2022-02-22 DIAGNOSIS — R790 Abnormal level of blood mineral: Secondary | ICD-10-CM | POA: Diagnosis not present

## 2022-02-22 DIAGNOSIS — E291 Testicular hypofunction: Secondary | ICD-10-CM | POA: Diagnosis not present

## 2022-02-22 DIAGNOSIS — Z7984 Long term (current) use of oral hypoglycemic drugs: Secondary | ICD-10-CM | POA: Diagnosis not present

## 2022-02-22 DIAGNOSIS — I10 Essential (primary) hypertension: Secondary | ICD-10-CM | POA: Diagnosis not present

## 2022-02-22 DIAGNOSIS — E1169 Type 2 diabetes mellitus with other specified complication: Secondary | ICD-10-CM | POA: Diagnosis not present

## 2022-02-22 DIAGNOSIS — E782 Mixed hyperlipidemia: Secondary | ICD-10-CM | POA: Diagnosis not present

## 2022-02-22 DIAGNOSIS — Z6837 Body mass index (BMI) 37.0-37.9, adult: Secondary | ICD-10-CM | POA: Diagnosis not present

## 2022-02-28 ENCOUNTER — Telehealth: Payer: Self-pay | Admitting: Hematology

## 2022-02-28 NOTE — Telephone Encounter (Signed)
Scheduled follow-up appointment per 8/24 staff message. Patient is aware.

## 2022-03-06 ENCOUNTER — Other Ambulatory Visit: Payer: Self-pay

## 2022-03-06 ENCOUNTER — Encounter: Payer: Self-pay | Admitting: Hematology

## 2022-03-06 ENCOUNTER — Inpatient Hospital Stay: Payer: PPO | Attending: Hematology | Admitting: Hematology

## 2022-03-06 VITALS — BP 139/80 | HR 81 | Temp 98.2°F | Resp 19 | Ht 69.0 in | Wt 255.2 lb

## 2022-03-06 DIAGNOSIS — E119 Type 2 diabetes mellitus without complications: Secondary | ICD-10-CM | POA: Insufficient documentation

## 2022-03-06 DIAGNOSIS — Z86718 Personal history of other venous thrombosis and embolism: Secondary | ICD-10-CM | POA: Insufficient documentation

## 2022-03-06 DIAGNOSIS — Z7901 Long term (current) use of anticoagulants: Secondary | ICD-10-CM | POA: Insufficient documentation

## 2022-03-06 DIAGNOSIS — Z8616 Personal history of COVID-19: Secondary | ICD-10-CM | POA: Diagnosis not present

## 2022-03-06 DIAGNOSIS — Z86711 Personal history of pulmonary embolism: Secondary | ICD-10-CM | POA: Diagnosis not present

## 2022-03-06 DIAGNOSIS — Z79899 Other long term (current) drug therapy: Secondary | ICD-10-CM | POA: Insufficient documentation

## 2022-03-06 DIAGNOSIS — E291 Testicular hypofunction: Secondary | ICD-10-CM | POA: Diagnosis not present

## 2022-03-06 DIAGNOSIS — J45909 Unspecified asthma, uncomplicated: Secondary | ICD-10-CM | POA: Diagnosis not present

## 2022-03-06 DIAGNOSIS — R052 Subacute cough: Secondary | ICD-10-CM | POA: Diagnosis not present

## 2022-03-06 NOTE — Progress Notes (Signed)
[place]   [phone] [phone]   Clinic Follow up Note   Patient Care Team: Antony Contras, MD as PCP - General (Family Medicine) Sueanne Margarita, MD as PCP - Sleep Medicine (Sleep Medicine)  Date of Service:  03/06/2022  CHIEF COMPLAINT: f/u of DVT/PE  CURRENT THERAPY:  Eliquis   ASSESSMENT & PLAN:  Eric Campbell is a 71 y.o. male with   1. History of DVT and PE, probably provoked  -Pt developed R leg swelling and was found to have acute extensive R leg DVT and at least submassive PE in all 5 lobes 04/2021. No increased dyspnea or hypoxia. -no personal or family history of DVT/PE. Hypercoagulable work up was negative. -Risk factors include obesity and sedentary lifestyle from chronic back pain, and recent COVID-19 infection. Testosterone is a lower risk factor likely did not contribute much -He was treated with heparin in the hospital and transitioned to Eliquis, tolerating well without bleeding.  -repeat right doppler on 07/27/21 showed improvement with residual thrombus within profunda femoral vein and 1 of 2 posterior tibial veins. -chest CT 09/29/21 showed resolution of PE. -he stopped testosterone in April or May 2023 and his mobility has much improved, he is doing routine daily activities. He wonders if he should now stop the eliquis. I discussed that he can, and I advised him to restart baby aspirin when he does. We also discussed the option of half dose eliquis. We reviewed signs to watch for, including leg swelling and/or pain. I feel he can come off the eliquis now and restart baby aspirin.   2. COVID-19 infection sept/October 2022 -Pt developed mild symptoms 4-6 weeks before DVT/PE, he tested positive on 2 home tests.  -Despite mild symptoms he completed Paxlovid due to his co-morbidities -He recovered well -this likely strongly contributed to DVT/PE   3. Hypogonadism on testosterone, asthma, DM, other co-morbidities   -continue  f/up and med regimen per PCP Dr. Moreen Fowler  -he discontinued testosterone in 4-10/2021.     PLAN: -Given much reduced risk for thrombosis, I think it's fine to stop eliqius and take baby aspirin alone, alternatively he can either take half dose eliquis indefinitely  -f/u open, as needed    No problem-specific Assessment & Plan notes found for this encounter.   INTERVAL HISTORY:  Eric Campbell is here for a follow up of DVT/PE. He was last seen by me with NP Lacie on 05/17/21 in consultation. He presents to the clinic alone. He reports he is doing better. He tells me he came off testosterone, in part due to cost, about 4-5 months ago. We previously told him he should keep taking Eliquis while on testosterone, so he wonders if he can stop the Eliquis now.   All other systems were reviewed with the patient and are negative.  MEDICAL HISTORY:  Past Medical History:  Diagnosis Date   Allergic rhinitis    Allergy    Asthma    BPH (benign prostatic hyperplasia)    Colon polyps    Congenital hearing loss    Diabetes mellitus    diet controlled   Dyslipidemia    ED (erectile dysfunction)    Hyperlipidemia    Hypertension    Hypogonadism male    Obesity    Osteopenia    Seasonal allergies    Sleep apnea     wears cpap   Torn ligament    left ankle   Vitamin D deficiency  SURGICAL HISTORY: Past Surgical History:  Procedure Laterality Date   ACHILLES TENDON SURGERY Left    ankle   COLONOSCOPY     POLYPECTOMY     UMBILICAL HERNIA REPAIR  1998    I have reviewed the social history and family history with the patient and they are unchanged from previous note.  ALLERGIES:  is allergic to lotrisone [clotrimazole-betamethasone], penicillins, and cephalexin.  MEDICATIONS:  Current Outpatient Medications  Medication Sig Dispense Refill   albuterol (PROVENTIL HFA;VENTOLIN HFA) 108 (90 BASE) MCG/ACT inhaler Inhale 2 puffs into the lungs every 4 (four) hours as needed for  wheezing or shortness of breath.     alclomethasone (ACLOVATE) 0.05 % cream Apply 1 application topically 2 (two) times daily.     apixaban (ELIQUIS) 5 MG TABS tablet Take 1 tablet (5 mg total) by mouth 2 (two) times daily. Start this once you finish starter pack 60 tablet 1   APIXABAN (ELIQUIS) VTE STARTER PACK ('10MG'$  AND '5MG'$ ) Take as directed on package: start with two-'5mg'$  tablets twice daily for 7 days. On day 8, switch to one-'5mg'$  tablet twice daily. 1 each 0   atorvastatin (LIPITOR) 20 MG tablet Take 20 mg by mouth daily.      augmented betamethasone dipropionate (DIPROLENE-AF) 0.05 % cream Apply 1 application topically in the morning and at bedtime.     B Complex-Folic Acid (B COMPLEX-VITAMIN B12 PO) Take 2-3 tablets by mouth daily.     budesonide-formoterol (SYMBICORT) 80-4.5 MCG/ACT inhaler 2 puffs in the morning right when you wake up, rinse out your mouth after use, 12 hours later 2 puffs, rinse after use.Take this daily, no matter what.This is not a rescue inhaler 1 each 12   Calcium Carbonate 1500 (600 CA) MG TABS Take 1 tablet by mouth daily.      Continuous Blood Gluc Sensor (FREESTYLE LIBRE 14 DAY SENSOR) MISC as directed every 2 weeks     Continuous Blood Gluc Sensor (FREESTYLE LIBRE 14 DAY SENSOR) MISC Apply topically every 14 (fourteen) days.     losartan (COZAAR) 100 MG tablet Take 100 mg by mouth daily.     Magnesium 250 MG TABS Take 1 tablet by mouth 2 (two) times daily.     metFORMIN (GLUCOPHAGE) 1000 MG tablet Take 1,000 mg by mouth 2 (two) times daily.     Omega-3 Fatty Acids (FISH OIL) 1000 MG CAPS Take 1 capsule by mouth daily.     OneTouch Delica Lancets 71G MISC TEST BLOOD GLUCOSE ONCE DAILY/ DX E11.65     SHINGRIX injection      Testosterone 1.62 % GEL 2 pumps to skin in the morning to shoulder, upper arms or abdomen     Testosterone 20.25 MG/ACT (1.62%) GEL Apply 2 Pump topically daily.     tiZANidine (ZANAFLEX) 2 MG tablet 1 tablet as needed for     triamcinolone  cream (KENALOG) 0.1 % Apply 1 application topically daily as needed for rash.     Vitamin D, Ergocalciferol, 2000 units CAPS Take 2 capsules by mouth 2 (two) times daily.     No current facility-administered medications for this visit.    PHYSICAL EXAMINATION: ECOG PERFORMANCE STATUS: 1 - Symptomatic but completely ambulatory  Vitals:   03/06/22 0857  BP: 139/80  Pulse: 81  Resp: 19  Temp: 98.2 F (36.8 C)  SpO2: 97%   Wt Readings from Last 3 Encounters:  03/06/22 255 lb 3.2 oz (115.8 kg)  05/17/21 253 lb 9.6 oz (115 kg)  04/30/21 244 lb 14.4 oz (111.1 kg)     GENERAL:alert, no distress and comfortable SKIN: skin color normal, no rashes or significant lesions EYES: normal, Conjunctiva are pink and non-injected, sclera clear  HEART: no lower extremity edema NEURO: alert & oriented x 3 with fluent speech  LABORATORY DATA:  I have reviewed the data as listed    Latest Ref Rng & Units 04/30/2021    5:52 AM 04/29/2021    3:14 AM 04/28/2021    5:19 PM  CBC  WBC 4.0 - 10.5 K/uL 7.3  7.8  7.5   Hemoglobin 13.0 - 17.0 g/dL 13.2  13.8  14.4   Hematocrit 39.0 - 52.0 % 37.5  38.1  40.8   Platelets 150 - 400 K/uL 221  222  218         Latest Ref Rng & Units 04/30/2021    5:52 AM 04/28/2021    5:19 PM  CMP  Glucose 70 - 99 mg/dL 130  101   BUN 8 - 23 mg/dL 7  11   Creatinine 0.61 - 1.24 mg/dL 0.98  0.97   Sodium 135 - 145 mmol/L 138  140   Potassium 3.5 - 5.1 mmol/L 3.8  3.9   Chloride 98 - 111 mmol/L 107  106   CO2 22 - 32 mmol/L 25  27   Calcium 8.9 - 10.3 mg/dL 8.8  9.5       RADIOGRAPHIC STUDIES: I have personally reviewed the radiological images as listed and agreed with the findings in the report. No results found.    No orders of the defined types were placed in this encounter.  All questions were answered. The patient knows to call the clinic with any problems, questions or concerns. No barriers to learning was detected. The total time spent in the  appointment was 25 minutes.     Truitt Merle, MD 03/06/2022   I, Wilburn Mylar, am acting as scribe for Truitt Merle, MD.   I have reviewed the above documentation for accuracy and completeness, and I agree with the above.

## 2022-04-09 DIAGNOSIS — E782 Mixed hyperlipidemia: Secondary | ICD-10-CM | POA: Diagnosis not present

## 2022-04-09 DIAGNOSIS — E1165 Type 2 diabetes mellitus with hyperglycemia: Secondary | ICD-10-CM | POA: Diagnosis not present

## 2022-04-09 DIAGNOSIS — I10 Essential (primary) hypertension: Secondary | ICD-10-CM | POA: Diagnosis not present

## 2022-05-08 DIAGNOSIS — E782 Mixed hyperlipidemia: Secondary | ICD-10-CM | POA: Diagnosis not present

## 2022-05-08 DIAGNOSIS — J45909 Unspecified asthma, uncomplicated: Secondary | ICD-10-CM | POA: Diagnosis not present

## 2022-05-08 DIAGNOSIS — E1165 Type 2 diabetes mellitus with hyperglycemia: Secondary | ICD-10-CM | POA: Diagnosis not present

## 2022-05-08 DIAGNOSIS — I10 Essential (primary) hypertension: Secondary | ICD-10-CM | POA: Diagnosis not present

## 2022-05-09 ENCOUNTER — Ambulatory Visit: Payer: PPO | Admitting: Podiatry

## 2022-05-11 ENCOUNTER — Encounter: Payer: Self-pay | Admitting: Podiatry

## 2022-05-11 ENCOUNTER — Ambulatory Visit (INDEPENDENT_AMBULATORY_CARE_PROVIDER_SITE_OTHER): Payer: PPO | Admitting: Podiatry

## 2022-05-11 DIAGNOSIS — R600 Localized edema: Secondary | ICD-10-CM

## 2022-05-11 DIAGNOSIS — M79676 Pain in unspecified toe(s): Secondary | ICD-10-CM | POA: Diagnosis not present

## 2022-05-11 DIAGNOSIS — B351 Tinea unguium: Secondary | ICD-10-CM

## 2022-05-11 DIAGNOSIS — E119 Type 2 diabetes mellitus without complications: Secondary | ICD-10-CM

## 2022-05-11 DIAGNOSIS — E1159 Type 2 diabetes mellitus with other circulatory complications: Secondary | ICD-10-CM | POA: Insufficient documentation

## 2022-05-11 NOTE — Progress Notes (Signed)
This patient returns to my office for at risk foot care.  This patient requires this care by a professional since this patient will be at risk due to having type 2 diabetes and leg swelling.  This patient is unable to cut nails himself since the patient cannot reach his nails.These nails are painful walking and wearing shoes.  This patient presents for at risk foot care today.  General Appearance  Alert, conversant and in no acute stress.  Vascular  Dorsalis pedis and posterior tibial  pulses are palpable  left foot.  Dorsalis pedis and posterior tibial pulses are weakly palpable due to leg swelling..  Capillary return is within normal limits  bilaterally. Temperature is within normal limits  bilaterally.  Neurologic  Senn-Weinstein monofilament wire test within normal limits  bilaterally. Muscle power within normal limits bilaterally.  Nails Thick disfigured discolored nails with subungual debris  from hallux to fifth toes bilaterally. No evidence of bacterial infection or drainage bilaterally.  Orthopedic  No limitations of motion  feet .  No crepitus or effusions noted.  No bony pathology or digital deformities noted.  DJD 1st MPJ  B/L.  Skin  normotropic skin with no porokeratosis noted bilaterally.  No signs of infections or ulcers noted.     Onychomycosis  Pain in right toes  Pain in left toes  Consent was obtained for treatment procedures.   Mechanical debridement of nails 1-5  bilaterally performed with a nail nipper.  Filed with dremel without incident.     Return office visit   3 months                  Told patient to return for periodic foot care and evaluation due to potential at risk complications.   Gardiner Barefoot DPM

## 2022-06-04 DIAGNOSIS — E119 Type 2 diabetes mellitus without complications: Secondary | ICD-10-CM | POA: Diagnosis not present

## 2022-06-04 DIAGNOSIS — H0259 Other disorders affecting eyelid function: Secondary | ICD-10-CM | POA: Diagnosis not present

## 2022-06-04 DIAGNOSIS — H11152 Pinguecula, left eye: Secondary | ICD-10-CM | POA: Diagnosis not present

## 2022-06-04 DIAGNOSIS — H2513 Age-related nuclear cataract, bilateral: Secondary | ICD-10-CM | POA: Diagnosis not present

## 2022-06-04 DIAGNOSIS — Z7984 Long term (current) use of oral hypoglycemic drugs: Secondary | ICD-10-CM | POA: Diagnosis not present

## 2022-06-04 DIAGNOSIS — H524 Presbyopia: Secondary | ICD-10-CM | POA: Diagnosis not present

## 2022-06-04 DIAGNOSIS — H52203 Unspecified astigmatism, bilateral: Secondary | ICD-10-CM | POA: Diagnosis not present

## 2022-06-04 DIAGNOSIS — D3132 Benign neoplasm of left choroid: Secondary | ICD-10-CM | POA: Diagnosis not present

## 2022-06-04 DIAGNOSIS — H5201 Hypermetropia, right eye: Secondary | ICD-10-CM | POA: Diagnosis not present

## 2022-06-05 DIAGNOSIS — L281 Prurigo nodularis: Secondary | ICD-10-CM | POA: Diagnosis not present

## 2022-06-05 DIAGNOSIS — L570 Actinic keratosis: Secondary | ICD-10-CM | POA: Diagnosis not present

## 2022-06-05 DIAGNOSIS — X32XXXD Exposure to sunlight, subsequent encounter: Secondary | ICD-10-CM | POA: Diagnosis not present

## 2022-06-05 DIAGNOSIS — L308 Other specified dermatitis: Secondary | ICD-10-CM | POA: Diagnosis not present

## 2022-06-05 DIAGNOSIS — D225 Melanocytic nevi of trunk: Secondary | ICD-10-CM | POA: Diagnosis not present

## 2022-07-05 ENCOUNTER — Ambulatory Visit: Payer: PPO | Admitting: Podiatry

## 2022-07-05 DIAGNOSIS — Q828 Other specified congenital malformations of skin: Secondary | ICD-10-CM

## 2022-07-05 DIAGNOSIS — E1159 Type 2 diabetes mellitus with other circulatory complications: Secondary | ICD-10-CM

## 2022-07-05 NOTE — Progress Notes (Signed)
Subjective:  Patient ID: Eric Campbell, male    DOB: May 18, 1951,  MRN: [id number]  Chief Complaint  Patient presents with   Foot Pain    Plantar wart on heel of left foot,     72 y.o. male presents with concern for possible plantar wart on the heel of the left foot.  He went to a dermatologist who recommended salicylic acid for possible wart. He has been treating with salicylic acid.  States that recently he started treating it again but it had gone away and it got very inflamed and painful.  It has been getting better since he stopped using salicylic acid. Does have a history of DM 2.   Past Medical History:  Diagnosis Date   Allergic rhinitis    Allergy    Asthma    BPH (benign prostatic hyperplasia)    Colon polyps    Congenital hearing loss    Diabetes mellitus    diet controlled   Dyslipidemia    ED (erectile dysfunction)    Hyperlipidemia    Hypertension    Hypogonadism male    Obesity    Osteopenia    Seasonal allergies    Sleep apnea     wears cpap   Torn ligament    left ankle   Vitamin D deficiency     Allergies  Allergen Reactions   Lotrisone [Clotrimazole-Betamethasone] Rash   Penicillins Hives and Swelling   Cephalexin Swelling    ROS: Negative except as per HPI above  Objective:  General: AAO x3, NAD  Dermatological: Attention directed to the plantar aspect of the left heel there is a area of white macerated tissue with mild tenderness to palpation. Upon debridement of the lesion there is healthy pink epithelium underlying no ulceration. There is a circular punctate lesion extending into the heel with most pain on that area directly. No petechial bleeding or abnormal skin lines to suggest plantar wart. Overall consistent with callus and porokertosis with macerated skin overlying.   Vascular:  Dorsalis Pedis artery and Posterior Tibial artery pedal pulses are 2/4 bilateral.  Capillary fill time < 3 sec to all digits.   Neruologic: Grossly intact via  light touch bilateral. Protective threshold intact to all sites bilateral.   Musculoskeletal: No gross boney pedal deformities bilateral. No pain, crepitus, or limitation noted with foot and ankle range of motion bilateral. Muscular strength 5/5 in all groups tested bilateral.  Gait: Unassisted, Nonantalgic.     Assessment:   1. Porokeratosis   2. Type 2 diabetes mellitus with vascular disease ([place])      Plan:  Patient was evaluated and treated and all questions answered.  # Porokeratosis plantar aspect of the left heel with macerated hyperkeratotic skin overlying  All symptomatic hyperkeratoses x1 were safely debrided with a sterile #15 blade to patient's level of comfort without incident. We discussed preventative and palliative care of these lesions including supportive and accommodative shoegear, padding, prefabricated and custom molded accommodative orthoses, use of a pumice stone . Recommend strict moisture control and use of antiperspirant for foot like carpe to prevent excess sweating. Want to continue to monitor the area after debridement and discontinue use of salicylic acid as I believe it was making the area more tender.    Return if symptoms worsen or fail to improve.          Everitt Amber, DPM [facility]

## 2022-07-19 ENCOUNTER — Ambulatory Visit: Payer: PPO | Admitting: Adult Health

## 2022-07-19 ENCOUNTER — Encounter: Payer: Self-pay | Admitting: Adult Health

## 2022-07-19 VITALS — BP 108/60 | HR 67 | Temp 97.6°F | Ht 68.5 in | Wt 256.0 lb

## 2022-07-19 DIAGNOSIS — G4733 Obstructive sleep apnea (adult) (pediatric): Secondary | ICD-10-CM

## 2022-07-19 DIAGNOSIS — J4540 Moderate persistent asthma, uncomplicated: Secondary | ICD-10-CM

## 2022-07-19 DIAGNOSIS — Z86711 Personal history of pulmonary embolism: Secondary | ICD-10-CM

## 2022-07-19 NOTE — Assessment & Plan Note (Signed)
Since last visit patient has developed a new probable provoked PE and DVT in October 2022.  Has been seen by hematology felt secondary to obesity, sedentary lifestyle and COVID-19 infection within a few weeks of development of the VTE.  Hypercoagulable panel was reported as negative.  Follow-up CT chest September 29, 2021 showed resolution of PE.  Patient completed a course of Eliquis.  And is no longer on anticoagulation therapy.  Also is off of testosterone.  Also follow-up chest CT showed previously noted pulmonary nodules had decreased and near completely resolved.

## 2022-07-19 NOTE — Progress Notes (Signed)
$'@Patient'Y$  ID: Eric Campbell, male    DOB: October 19, 1950, 72 y.o.   MRN: [id number]  Chief Complaint  Patient presents with   Follow-up    Referring provider: Antony Contras, MD  HPI: 72 year old male never smoker followed for asthma with allergic phenotype and obstructive sleep apnea  TEST/EVENTS :  FeNO: 96 ppb in December 2021  Spirometry June 02, 2020 showed moderate airflow obstruction with significant airflow reversibility FEV1 77%, ratio 74, FVC 77%, significant bronchodilator response (30% change)   07/19/2022 Follow up : Asthma and sleep apnea Patient presents for a follow-up visit.  Last seen January 2022 patient has underlying mild to moderate persistent asthma.  Patient says overall breathing is doing okay . Gets some dyspnea on occasion. Occasional wheezing . No significant cough . No increased albuterol use.   Patient has underlying sleep apnea.  Says he wears a CPAP every single night.  Cannot sleep without it.  Feels that he benefits from CPAP.  Patient says his CPAP machine needs to be replaced.  It is coming up with an error message that his motor life is exceeded its life expectancy.  We have requested a CPAP download unfortunately his machine will not automatically download and he does not have an SD card.  We have contacted his DME company and ordered an SD card.  Patient says he developed a PE and DVT in October 2022.  PE showed a segmental pulmonary emboli in all 5 lobes with associated right heart strain.  CT also showed some subsegmental subpleural nodule in the right lung.  Follow-up CT chest September 29, 2021 showed previously noted pulmonary nodules have decreased in size or completely resolved. Seen by Hematology felt probably from obesity, sedentary lifestyle and recent covid 19 infection . Less likely Testosterone. Currently off Testosterone. Treated with Eliquis for several months. Hypercoaguable panel was negative. repeat right doppler on 07/27/21 showed  improvement with residual thrombus within profunda femoral vein and 1 of 2 posterior tibial veins. -chest CT 09/29/21 showed resolution of PE.        Allergies  Allergen Reactions   Lotrisone [Clotrimazole-Betamethasone] Rash   Penicillins Hives and Swelling   Cephalexin Swelling    Immunization History  Administered Date(s) Administered   Fluad Quad(high Dose 65+) 04/06/2022   Influenza Split 08/15/2009, 04/10/2010, 04/22/2012   Influenza, High Dose Seasonal PF 05/10/2014   Influenza, Seasonal, Injecte, Preservative Fre 04/01/2020   Influenza,inj,Quad PF,6+ Mos 04/22/2012, 07/02/2019   Influenza-Unspecified 08/15/2009, 04/10/2010, 04/23/2011, 04/20/2013   Moderna SARS-COV2 Booster Vaccination 04/01/2020, 04/06/2022   Moderna Sars-Covid-2 Vaccination 04/27/2020   PFIZER(Purple Top)SARS-COV-2 Vaccination 08/06/2019, 08/27/2019   Pneumococcal Conjugate-13 05/10/2014   Pneumococcal Polysaccharide-23 10/24/2010, 11/12/2017   Respiratory Syncytial Virus Vaccine,Recomb Aduvanted(Arexvy) 04/06/2022   Tdap 04/20/2013   Zoster, Live 04/20/2013    Past Medical History:  Diagnosis Date   Allergic rhinitis    Allergy    Asthma    BPH (benign prostatic hyperplasia)    Colon polyps    Congenital hearing loss    Diabetes mellitus    diet controlled   Dyslipidemia    ED (erectile dysfunction)    Hyperlipidemia    Hypertension    Hypogonadism male    Obesity    Osteopenia    Seasonal allergies    Sleep apnea     wears cpap   Torn ligament    left ankle   Vitamin D deficiency     Tobacco History: Social History   Tobacco Use  Smoking  Status Never  Smokeless Tobacco Never   Counseling given: Not Answered   Outpatient Medications Prior to Visit  Medication Sig Dispense Refill   albuterol (PROVENTIL HFA;VENTOLIN HFA) 108 (90 BASE) MCG/ACT inhaler Inhale 2 puffs into the lungs every 4 (four) hours as needed for wheezing or shortness of breath.     alclomethasone  (ACLOVATE) 0.05 % cream Apply 1 application topically 2 (two) times daily.     atorvastatin (LIPITOR) 20 MG tablet Take 20 mg by mouth daily.      augmented betamethasone dipropionate (DIPROLENE-AF) 0.05 % cream Apply 1 application topically in the morning and at bedtime.     B Complex-Folic Acid (B COMPLEX-VITAMIN B12 PO) Take 2-3 tablets by mouth daily.     budesonide-formoterol (SYMBICORT) 80-4.5 MCG/ACT inhaler 2 puffs in the morning right when you wake up, rinse out your mouth after use, 12 hours later 2 puffs, rinse after use.Take this daily, no matter what.This is not a rescue inhaler 1 each 12   Calcium Carbonate 1500 (600 CA) MG TABS Take 1 tablet by mouth daily.      Continuous Blood Gluc Sensor (FREESTYLE LIBRE 14 DAY SENSOR) MISC as directed every 2 weeks     Continuous Blood Gluc Sensor (FREESTYLE LIBRE 14 DAY SENSOR) MISC Apply topically every 14 (fourteen) days.     losartan (COZAAR) 100 MG tablet Take 100 mg by mouth daily.     Magnesium 250 MG TABS Take 1 tablet by mouth 2 (two) times daily.     metFORMIN (GLUCOPHAGE) 1000 MG tablet Take 1,000 mg by mouth 2 (two) times daily.     Omega-3 Fatty Acids (FISH OIL) 1000 MG CAPS Take 1 capsule by mouth daily.     OneTouch Delica Lancets 50P MISC TEST BLOOD GLUCOSE ONCE DAILY/ DX E11.65     SHINGRIX injection      tiZANidine (ZANAFLEX) 2 MG tablet 1 tablet as needed for     triamcinolone cream (KENALOG) 0.1 % Apply 1 application topically daily as needed for rash.     Vitamin D, Ergocalciferol, 2000 units CAPS Take 2 capsules by mouth 2 (two) times daily.     apixaban (ELIQUIS) 5 MG TABS tablet Take 1 tablet (5 mg total) by mouth 2 (two) times daily. Start this once you finish starter pack (Patient not taking: Reported on 07/19/2022) 60 tablet 1   APIXABAN (ELIQUIS) VTE STARTER PACK ('10MG'$  AND '5MG'$ ) Take as directed on package: start with two-'5mg'$  tablets twice daily for 7 days. On day 8, switch to one-'5mg'$  tablet twice daily. (Patient not  taking: Reported on 07/19/2022) 1 each 0   No facility-administered medications prior to visit.     Review of Systems:   Constitutional:   No  weight loss, night sweats,  Fevers, chills, fatigue, or  lassitude.  HEENT:   No headaches,  Difficulty swallowing,  Tooth/dental problems, or  Sore throat,                No sneezing, itching, ear ache, nasal congestion, post nasal drip,   CV:  No chest pain,  Orthopnea, PND, swelling in lower extremities, anasarca, dizziness, palpitations, syncope.   GI  No heartburn, indigestion, abdominal pain, nausea, vomiting, diarrhea, change in bowel habits, loss of appetite, bloody stools.   Resp: No shortness of breath with exertion or at rest.  No excess mucus, no productive cough,  No non-productive cough,  No coughing up of blood.  No change in color of mucus.  No  wheezing.  No chest wall deformity  Skin: no rash or lesions.  GU: no dysuria, change in color of urine, no urgency or frequency.  No flank pain, no hematuria   MS:  No joint pain or swelling.  No decreased range of motion.  No back pain.    Physical Exam  BP 108/60 (BP Location: Left Arm, Patient Position: Sitting, Cuff Size: Large)   Pulse 67   Temp 97.6 F (36.4 C) (Oral)   Ht 5' 8.5" (1.74 m)   Wt 256 lb (116.1 kg)   SpO2 95%   BMI 38.36 kg/m   GEN: A/Ox3; pleasant , NAD, well nourished    HEENT:  [place]/AT,  NOSE-clear, THROAT-clear, no lesions, no postnasal drip or exudate noted.  Class 3 MP airway   NECK:  Supple w/ fair ROM; no JVD; normal carotid impulses w/o bruits; no thyromegaly or nodules palpated; no lymphadenopathy.    RESP  Clear  P & A; w/o, wheezes/ rales/ or rhonchi. no accessory muscle use, no dullness to percussion  CARD:  RRR, no m/r/g, no peripheral edema, pulses intact, no cyanosis or clubbing.  GI:   Soft & nt; nml bowel sounds; no organomegaly or masses detected.   Musco: Warm bil, no deformities or joint swelling noted.   Neuro: alert, no focal  deficits noted.    Skin: Warm, no lesions or rashes    Lab Results:  CBC   ProBNP No results found for: "PROBNP"  Imaging: No results found.       Latest Ref Rng & Units 06/02/2020    1:18 PM  PFT Results  FVC-Pre L 2.94   FVC-Predicted Pre % 69   FVC-Post L 3.26   FVC-Predicted Post % 77   Pre FEV1/FVC % % 63   Post FEV1/FCV % % 74   FEV1-Pre L 1.84   FEV1-Predicted Pre % 59   FEV1-Post L 2.40     Lab Results  Component Value Date   NITRICOXIDE 96 06/02/2020        Assessment & Plan:   Asthma Controlled on current regimen.  Continue with trigger prevention.  Plan  Patient Instructions  Continue on Symbicort 2 puffs Twice daily , rinse after use  Albuterol inhaler As needed    Activity as tolerated.   Continue on CPAP At bedtime   Work on healthy weight loss  Do not drive if sleepy  Order for new CPAP .  CPAP download  CPAP SD card   Follow up with Dr. Shearon Stalls in 6 months and As needed      OSA (obstructive sleep apnea) Reported excellent compliance on CPAP.  Patient needs a new machine as his CPAP machine is showing an error message for motor life has exceeded life expectancy.  SD card has been requested as patient's machine is older and does not automatically transmit.   Plan  Patient Instructions  Continue on Symbicort 2 puffs Twice daily , rinse after use  Albuterol inhaler As needed    Activity as tolerated.   Continue on CPAP At bedtime   Work on healthy weight loss  Do not drive if sleepy  Order for new CPAP .  CPAP download  CPAP SD card   Follow up with Dr. Shearon Stalls in 6 months and As needed      Hx of pulmonary embolus Since last visit patient has developed a new probable provoked PE and DVT in October 2022.  Has been seen by hematology felt secondary  to obesity, sedentary lifestyle and COVID-19 infection within a few weeks of development of the VTE.  Hypercoagulable panel was reported as negative.  Follow-up CT chest September 29, 2021 showed resolution of PE.  Patient completed a course of Eliquis.  And is no longer on anticoagulation therapy.  Also is off of testosterone.  Also follow-up chest CT showed previously noted pulmonary nodules had decreased and near completely resolved.     Rexene Edison, NP 07/19/2022

## 2022-07-19 NOTE — Assessment & Plan Note (Signed)
Reported excellent compliance on CPAP.  Patient needs a new machine as his CPAP machine is showing an error message for motor life has exceeded life expectancy.  SD card has been requested as patient's machine is older and does not automatically transmit.   Plan  Patient Instructions  Continue on Symbicort 2 puffs Twice daily , rinse after use  Albuterol inhaler As needed    Activity as tolerated.   Continue on CPAP At bedtime   Work on healthy weight loss  Do not drive if sleepy  Order for new CPAP .  CPAP download  CPAP SD card   Follow up with Dr. Shearon Stalls in 6 months and As needed

## 2022-07-19 NOTE — Assessment & Plan Note (Signed)
Controlled on current regimen.  Continue with trigger prevention.  Plan  Patient Instructions  Continue on Symbicort 2 puffs Twice daily , rinse after use  Albuterol inhaler As needed    Activity as tolerated.   Continue on CPAP At bedtime   Work on healthy weight loss  Do not drive if sleepy  Order for new CPAP .  CPAP download  CPAP SD card   Follow up with Dr. Shearon Stalls in 6 months and As needed

## 2022-07-19 NOTE — Patient Instructions (Signed)
Continue on Symbicort 2 puffs Twice daily , rinse after use  Albuterol inhaler As needed    Activity as tolerated.   Continue on CPAP At bedtime   Work on healthy weight loss  Do not drive if sleepy  Order for new CPAP .  CPAP download  CPAP SD card   Follow up with Dr. Shearon Stalls in 6 months and As needed

## 2022-07-24 DIAGNOSIS — X32XXXD Exposure to sunlight, subsequent encounter: Secondary | ICD-10-CM | POA: Diagnosis not present

## 2022-07-24 DIAGNOSIS — Z1283 Encounter for screening for malignant neoplasm of skin: Secondary | ICD-10-CM | POA: Diagnosis not present

## 2022-07-24 DIAGNOSIS — D225 Melanocytic nevi of trunk: Secondary | ICD-10-CM | POA: Diagnosis not present

## 2022-07-24 DIAGNOSIS — L570 Actinic keratosis: Secondary | ICD-10-CM | POA: Diagnosis not present

## 2022-07-24 DIAGNOSIS — D485 Neoplasm of uncertain behavior of skin: Secondary | ICD-10-CM | POA: Diagnosis not present

## 2022-08-08 ENCOUNTER — Encounter: Payer: Self-pay | Admitting: Podiatry

## 2022-08-08 ENCOUNTER — Ambulatory Visit (INDEPENDENT_AMBULATORY_CARE_PROVIDER_SITE_OTHER): Payer: PPO | Admitting: Podiatry

## 2022-08-08 DIAGNOSIS — M79676 Pain in unspecified toe(s): Secondary | ICD-10-CM | POA: Diagnosis not present

## 2022-08-08 DIAGNOSIS — E1159 Type 2 diabetes mellitus with other circulatory complications: Secondary | ICD-10-CM | POA: Diagnosis not present

## 2022-08-08 DIAGNOSIS — B351 Tinea unguium: Secondary | ICD-10-CM | POA: Diagnosis not present

## 2022-08-08 NOTE — Progress Notes (Signed)
This patient returns to my office for at risk foot care.  This patient requires this care by a professional since this patient will be at risk due to having type 2 diabetes and leg swelling.  This patient is unable to cut nails himself since the patient cannot reach his nails.These nails are painful walking and wearing shoes.  This patient presents for at risk foot care today.  General Appearance  Alert, conversant and in no acute stress.  Vascular  Dorsalis pedis and posterior tibial  pulses are palpable  left foot.  Dorsalis pedis and posterior tibial pulses are weakly palpable due to leg swelling..  Capillary return is within normal limits  bilaterally. Temperature is within normal limits  bilaterally.  Neurologic  Senn-Weinstein monofilament wire test within normal limits  bilaterally. Muscle power within normal limits bilaterally.  Nails Thick disfigured discolored nails with subungual debris  from hallux to fifth toes bilaterally. No evidence of bacterial infection or drainage bilaterally.  Orthopedic  No limitations of motion  feet .  No crepitus or effusions noted.  No bony pathology or digital deformities noted.  DJD 1st MPJ  B/L.  Skin  normotropic skin with no porokeratosis noted bilaterally.  No signs of infections or ulcers noted.     Onychomycosis  Pain in right toes  Pain in left toes  Consent was obtained for treatment procedures.   Mechanical debridement of nails 1-5  bilaterally performed with a nail nipper.  Filed with dremel without incident.     Return office visit   3 months                  Told patient to return for periodic foot care and evaluation due to potential at risk complications.   Gardiner Barefoot DPM

## 2022-08-14 DIAGNOSIS — D485 Neoplasm of uncertain behavior of skin: Secondary | ICD-10-CM | POA: Diagnosis not present

## 2022-08-14 DIAGNOSIS — D225 Melanocytic nevi of trunk: Secondary | ICD-10-CM | POA: Diagnosis not present

## 2022-08-14 DIAGNOSIS — L988 Other specified disorders of the skin and subcutaneous tissue: Secondary | ICD-10-CM | POA: Diagnosis not present

## 2022-08-15 DIAGNOSIS — J45909 Unspecified asthma, uncomplicated: Secondary | ICD-10-CM | POA: Diagnosis not present

## 2022-08-15 DIAGNOSIS — J209 Acute bronchitis, unspecified: Secondary | ICD-10-CM | POA: Diagnosis not present

## 2022-08-15 DIAGNOSIS — E1169 Type 2 diabetes mellitus with other specified complication: Secondary | ICD-10-CM | POA: Diagnosis not present

## 2022-08-25 DIAGNOSIS — G4733 Obstructive sleep apnea (adult) (pediatric): Secondary | ICD-10-CM | POA: Diagnosis not present

## 2022-09-05 DIAGNOSIS — G4733 Obstructive sleep apnea (adult) (pediatric): Secondary | ICD-10-CM | POA: Diagnosis not present

## 2022-09-13 DIAGNOSIS — I10 Essential (primary) hypertension: Secondary | ICD-10-CM | POA: Diagnosis not present

## 2022-09-13 DIAGNOSIS — E119 Type 2 diabetes mellitus without complications: Secondary | ICD-10-CM | POA: Diagnosis not present

## 2022-09-13 DIAGNOSIS — E1169 Type 2 diabetes mellitus with other specified complication: Secondary | ICD-10-CM | POA: Diagnosis not present

## 2022-09-13 DIAGNOSIS — M85852 Other specified disorders of bone density and structure, left thigh: Secondary | ICD-10-CM | POA: Diagnosis not present

## 2022-09-13 DIAGNOSIS — R790 Abnormal level of blood mineral: Secondary | ICD-10-CM | POA: Diagnosis not present

## 2022-09-13 DIAGNOSIS — J309 Allergic rhinitis, unspecified: Secondary | ICD-10-CM | POA: Diagnosis not present

## 2022-09-13 DIAGNOSIS — M48062 Spinal stenosis, lumbar region with neurogenic claudication: Secondary | ICD-10-CM | POA: Diagnosis not present

## 2022-09-13 DIAGNOSIS — E782 Mixed hyperlipidemia: Secondary | ICD-10-CM | POA: Diagnosis not present

## 2022-09-13 DIAGNOSIS — I700 Atherosclerosis of aorta: Secondary | ICD-10-CM | POA: Diagnosis not present

## 2022-09-13 DIAGNOSIS — E291 Testicular hypofunction: Secondary | ICD-10-CM | POA: Diagnosis not present

## 2022-09-13 DIAGNOSIS — E559 Vitamin D deficiency, unspecified: Secondary | ICD-10-CM | POA: Diagnosis not present

## 2022-09-13 DIAGNOSIS — J45909 Unspecified asthma, uncomplicated: Secondary | ICD-10-CM | POA: Diagnosis not present

## 2022-09-13 DIAGNOSIS — M546 Pain in thoracic spine: Secondary | ICD-10-CM | POA: Diagnosis not present

## 2022-09-23 DIAGNOSIS — G4733 Obstructive sleep apnea (adult) (pediatric): Secondary | ICD-10-CM | POA: Diagnosis not present

## 2022-09-27 DIAGNOSIS — J45909 Unspecified asthma, uncomplicated: Secondary | ICD-10-CM | POA: Diagnosis not present

## 2022-09-27 DIAGNOSIS — J309 Allergic rhinitis, unspecified: Secondary | ICD-10-CM | POA: Diagnosis not present

## 2022-09-28 ENCOUNTER — Other Ambulatory Visit: Payer: Self-pay

## 2022-09-28 ENCOUNTER — Other Ambulatory Visit (HOSPITAL_COMMUNITY): Payer: Self-pay

## 2022-09-28 MED ORDER — GLUCOSE BLOOD VI STRP
1.0000 | ORAL_STRIP | Freq: Every day | 99 refills | Status: AC
  Filled 2022-09-28: qty 100, 100d supply, fill #0

## 2022-09-28 MED ORDER — ONETOUCH DELICA PLUS LANCET33G MISC
99 refills | Status: AC
  Filled 2022-09-28: qty 100, 100d supply, fill #0

## 2022-09-28 MED ORDER — FREESTYLE LIBRE 14 DAY SENSOR MISC
11 refills | Status: AC
  Filled 2022-09-28: qty 2, 28d supply, fill #0
  Filled ????-??-??: fill #0

## 2022-10-03 ENCOUNTER — Other Ambulatory Visit: Payer: Self-pay

## 2022-10-03 ENCOUNTER — Other Ambulatory Visit (HOSPITAL_COMMUNITY): Payer: Self-pay

## 2022-10-03 MED ORDER — ATORVASTATIN CALCIUM 20 MG PO TABS
20.0000 mg | ORAL_TABLET | Freq: Every day | ORAL | 1 refills | Status: DC
  Filled 2022-10-03: qty 90, 90d supply, fill #0

## 2022-10-03 MED ORDER — METFORMIN HCL 1000 MG PO TABS
1000.0000 mg | ORAL_TABLET | Freq: Two times a day (BID) | ORAL | 1 refills | Status: DC
  Filled 2022-10-03: qty 180, 90d supply, fill #0
  Filled 2023-04-09: qty 180, 90d supply, fill #1

## 2022-10-03 MED ORDER — LOSARTAN POTASSIUM 100 MG PO TABS
100.0000 mg | ORAL_TABLET | Freq: Every day | ORAL | 1 refills | Status: DC
  Filled 2022-10-03: qty 90, 90d supply, fill #0
  Filled 2023-09-26: qty 90, 90d supply, fill #1

## 2022-10-03 MED ORDER — FREESTYLE LIBRE 2 SENSOR MISC
1 refills | Status: DC
Start: 2022-10-01 — End: 2023-04-12
  Filled 2022-10-03 – 2022-10-19 (×3): qty 6, 84d supply, fill #0
  Filled 2023-01-24: qty 6, 84d supply, fill #1

## 2022-10-19 ENCOUNTER — Other Ambulatory Visit (HOSPITAL_COMMUNITY): Payer: Self-pay

## 2022-10-19 DIAGNOSIS — E119 Type 2 diabetes mellitus without complications: Secondary | ICD-10-CM | POA: Diagnosis not present

## 2022-10-19 DIAGNOSIS — N390 Urinary tract infection, site not specified: Secondary | ICD-10-CM | POA: Diagnosis not present

## 2022-10-19 DIAGNOSIS — G4700 Insomnia, unspecified: Secondary | ICD-10-CM | POA: Diagnosis not present

## 2022-10-24 DIAGNOSIS — G4733 Obstructive sleep apnea (adult) (pediatric): Secondary | ICD-10-CM | POA: Diagnosis not present

## 2022-11-07 ENCOUNTER — Encounter: Payer: Self-pay | Admitting: Podiatry

## 2022-11-07 ENCOUNTER — Ambulatory Visit (INDEPENDENT_AMBULATORY_CARE_PROVIDER_SITE_OTHER): Payer: PPO | Admitting: Podiatry

## 2022-11-07 DIAGNOSIS — M79676 Pain in unspecified toe(s): Secondary | ICD-10-CM | POA: Diagnosis not present

## 2022-11-07 DIAGNOSIS — B351 Tinea unguium: Secondary | ICD-10-CM

## 2022-11-07 DIAGNOSIS — E1159 Type 2 diabetes mellitus with other circulatory complications: Secondary | ICD-10-CM

## 2022-11-07 NOTE — Progress Notes (Signed)
This patient returns to my office for at risk foot care.  This patient requires this care by a professional since this patient will be at risk due to having type 2 diabetes and leg swelling.  This patient is unable to cut nails himself since the patient cannot reach his nails.These nails are painful walking and wearing shoes.  This patient presents for at risk foot care today.  General Appearance  Alert, conversant and in no acute stress.  Vascular  Dorsalis pedis and posterior tibial  pulses are palpable  left foot.  Dorsalis pedis and posterior tibial pulses are weakly palpable due to leg swelling..  Capillary return is within normal limits  bilaterally. Temperature is within normal limits  bilaterally.  Neurologic  Senn-Weinstein monofilament wire test within normal limits  bilaterally. Muscle power within normal limits bilaterally.  Nails Thick disfigured discolored nails with subungual debris  from hallux to fifth toes bilaterally. No evidence of bacterial infection or drainage bilaterally.  Orthopedic  No limitations of motion  feet .  No crepitus or effusions noted.  No bony pathology or digital deformities noted.  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  Skin  normotropic skin with no porokeratosis noted bilaterally.  No signs of infections or ulcers noted.     Onychomycosis  Pain in right toes  Pain in left toes  Consent was obtained for treatment procedures.   Mechanical debridement of nails 1-5  bilaterally performed with a nail nipper.  Filed with dremel without incident.     Return office visit   3 months                  Told patient to return for periodic foot care and evaluation due to potential at risk complications.   Helane Gunther DPM

## 2022-11-23 DIAGNOSIS — G4733 Obstructive sleep apnea (adult) (pediatric): Secondary | ICD-10-CM | POA: Diagnosis not present

## 2022-12-11 DIAGNOSIS — L570 Actinic keratosis: Secondary | ICD-10-CM | POA: Diagnosis not present

## 2022-12-11 DIAGNOSIS — D225 Melanocytic nevi of trunk: Secondary | ICD-10-CM | POA: Diagnosis not present

## 2022-12-11 DIAGNOSIS — X32XXXD Exposure to sunlight, subsequent encounter: Secondary | ICD-10-CM | POA: Diagnosis not present

## 2022-12-24 DIAGNOSIS — G4733 Obstructive sleep apnea (adult) (pediatric): Secondary | ICD-10-CM | POA: Diagnosis not present

## 2023-01-23 DIAGNOSIS — G4733 Obstructive sleep apnea (adult) (pediatric): Secondary | ICD-10-CM | POA: Diagnosis not present

## 2023-01-24 ENCOUNTER — Other Ambulatory Visit (HOSPITAL_COMMUNITY): Payer: Self-pay

## 2023-02-11 ENCOUNTER — Encounter: Payer: Self-pay | Admitting: Podiatry

## 2023-02-11 ENCOUNTER — Ambulatory Visit: Payer: PPO | Admitting: Podiatry

## 2023-02-11 DIAGNOSIS — B351 Tinea unguium: Secondary | ICD-10-CM | POA: Diagnosis not present

## 2023-02-11 DIAGNOSIS — M79676 Pain in unspecified toe(s): Secondary | ICD-10-CM | POA: Diagnosis not present

## 2023-02-11 DIAGNOSIS — E1159 Type 2 diabetes mellitus with other circulatory complications: Secondary | ICD-10-CM | POA: Diagnosis not present

## 2023-02-11 NOTE — Progress Notes (Signed)
This patient returns to my office for at risk foot care.  This patient requires this care by a professional since this patient will be at risk due to having type 2 diabetes and leg swelling.  This patient is unable to cut nails himself since the patient cannot reach his nails.These nails are painful walking and wearing shoes.  This patient presents for at risk foot care today.  General Appearance  Alert, conversant and in no acute stress.  Vascular  Dorsalis pedis and posterior tibial  pulses are palpable  left foot.  Dorsalis pedis and posterior tibial pulses are weakly palpable due to leg swelling..  Capillary return is within normal limits  bilaterally. Temperature is within normal limits  bilaterally.  Neurologic  Senn-Weinstein monofilament wire test within normal limits  bilaterally. Muscle power within normal limits bilaterally.  Nails Thick disfigured discolored nails with subungual debris  from hallux to fifth toes bilaterally. No evidence of bacterial infection or drainage bilaterally.  Orthopedic  No limitations of motion  feet .  No crepitus or effusions noted.  No bony pathology or digital deformities noted.  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  Skin  normotropic skin with no porokeratosis noted bilaterally.  No signs of infections or ulcers noted.   Asymptomatic pinch callus right foot.  Onychomycosis  Pain in right toes  Pain in left toes  Consent was obtained for treatment procedures.   Mechanical debridement of nails 1-5  bilaterally performed with a nail nipper.  Filed with dremel without incident.     Return office visit   3 months                  Told patient to return for periodic foot care and evaluation due to potential at risk complications.   Helane Gunther DPM

## 2023-02-23 DIAGNOSIS — G4733 Obstructive sleep apnea (adult) (pediatric): Secondary | ICD-10-CM | POA: Diagnosis not present

## 2023-03-06 DIAGNOSIS — R42 Dizziness and giddiness: Secondary | ICD-10-CM | POA: Diagnosis not present

## 2023-03-06 DIAGNOSIS — E119 Type 2 diabetes mellitus without complications: Secondary | ICD-10-CM | POA: Diagnosis not present

## 2023-03-26 DIAGNOSIS — G4733 Obstructive sleep apnea (adult) (pediatric): Secondary | ICD-10-CM | POA: Diagnosis not present

## 2023-04-04 ENCOUNTER — Other Ambulatory Visit (HOSPITAL_COMMUNITY): Payer: Self-pay

## 2023-04-04 ENCOUNTER — Other Ambulatory Visit: Payer: Self-pay

## 2023-04-04 ENCOUNTER — Other Ambulatory Visit: Payer: Self-pay | Admitting: Family Medicine

## 2023-04-04 DIAGNOSIS — R790 Abnormal level of blood mineral: Secondary | ICD-10-CM | POA: Diagnosis not present

## 2023-04-04 DIAGNOSIS — Z1331 Encounter for screening for depression: Secondary | ICD-10-CM | POA: Diagnosis not present

## 2023-04-04 DIAGNOSIS — E119 Type 2 diabetes mellitus without complications: Secondary | ICD-10-CM | POA: Diagnosis not present

## 2023-04-04 DIAGNOSIS — R5383 Other fatigue: Secondary | ICD-10-CM | POA: Diagnosis not present

## 2023-04-04 DIAGNOSIS — J45909 Unspecified asthma, uncomplicated: Secondary | ICD-10-CM | POA: Diagnosis not present

## 2023-04-04 DIAGNOSIS — M85852 Other specified disorders of bone density and structure, left thigh: Secondary | ICD-10-CM

## 2023-04-04 DIAGNOSIS — I2699 Other pulmonary embolism without acute cor pulmonale: Secondary | ICD-10-CM | POA: Diagnosis not present

## 2023-04-04 DIAGNOSIS — I10 Essential (primary) hypertension: Secondary | ICD-10-CM | POA: Diagnosis not present

## 2023-04-04 DIAGNOSIS — Z0000 Encounter for general adult medical examination without abnormal findings: Secondary | ICD-10-CM | POA: Diagnosis not present

## 2023-04-04 DIAGNOSIS — E291 Testicular hypofunction: Secondary | ICD-10-CM | POA: Diagnosis not present

## 2023-04-04 DIAGNOSIS — M546 Pain in thoracic spine: Secondary | ICD-10-CM | POA: Diagnosis not present

## 2023-04-04 DIAGNOSIS — E1169 Type 2 diabetes mellitus with other specified complication: Secondary | ICD-10-CM | POA: Diagnosis not present

## 2023-04-04 DIAGNOSIS — Z23 Encounter for immunization: Secondary | ICD-10-CM | POA: Diagnosis not present

## 2023-04-04 DIAGNOSIS — E782 Mixed hyperlipidemia: Secondary | ICD-10-CM | POA: Diagnosis not present

## 2023-04-04 DIAGNOSIS — I700 Atherosclerosis of aorta: Secondary | ICD-10-CM | POA: Diagnosis not present

## 2023-04-04 MED ORDER — OZEMPIC (0.25 OR 0.5 MG/DOSE) 2 MG/3ML ~~LOC~~ SOPN
0.2500 mg | PEN_INJECTOR | SUBCUTANEOUS | 0 refills | Status: DC
  Filled 2023-04-04: qty 3, 56d supply, fill #0

## 2023-04-04 MED ORDER — ATORVASTATIN CALCIUM 20 MG PO TABS
20.0000 mg | ORAL_TABLET | Freq: Every day | ORAL | 1 refills | Status: DC
  Filled 2023-04-04: qty 90, 90d supply, fill #0
  Filled 2023-07-01: qty 90, 90d supply, fill #1

## 2023-04-04 MED ORDER — LOSARTAN POTASSIUM 100 MG PO TABS
100.0000 mg | ORAL_TABLET | Freq: Every day | ORAL | 1 refills | Status: DC
  Filled 2023-04-04: qty 90, 90d supply, fill #0
  Filled 2023-07-01: qty 90, 90d supply, fill #1

## 2023-04-08 ENCOUNTER — Other Ambulatory Visit (HOSPITAL_COMMUNITY): Payer: Self-pay

## 2023-04-09 ENCOUNTER — Encounter: Payer: Self-pay | Admitting: Pharmacist

## 2023-04-09 ENCOUNTER — Other Ambulatory Visit: Payer: Self-pay

## 2023-04-09 ENCOUNTER — Other Ambulatory Visit (HOSPITAL_COMMUNITY): Payer: Self-pay

## 2023-04-11 ENCOUNTER — Other Ambulatory Visit (HOSPITAL_COMMUNITY): Payer: Self-pay

## 2023-04-12 ENCOUNTER — Other Ambulatory Visit (HOSPITAL_COMMUNITY): Payer: Self-pay

## 2023-04-12 MED ORDER — FREESTYLE LIBRE 2 SENSOR MISC
1 refills | Status: DC
  Filled 2023-04-12: qty 6, 84d supply, fill #0
  Filled 2023-07-04 – 2023-07-09 (×2): qty 6, 84d supply, fill #1

## 2023-04-17 ENCOUNTER — Other Ambulatory Visit: Payer: Self-pay

## 2023-04-17 ENCOUNTER — Encounter: Payer: Self-pay | Admitting: Internal Medicine

## 2023-04-25 DIAGNOSIS — G4733 Obstructive sleep apnea (adult) (pediatric): Secondary | ICD-10-CM | POA: Diagnosis not present

## 2023-05-07 ENCOUNTER — Other Ambulatory Visit (HOSPITAL_COMMUNITY): Payer: Self-pay

## 2023-05-07 MED ORDER — OZEMPIC (0.25 OR 0.5 MG/DOSE) 2 MG/3ML ~~LOC~~ SOPN
0.5000 mg | PEN_INJECTOR | SUBCUTANEOUS | 0 refills | Status: AC
  Filled 2023-05-07 – 2023-05-17 (×3): qty 3, 28d supply, fill #0
  Filled 2023-05-20: qty 3, fill #0
  Filled 2023-05-21: qty 3, 28d supply, fill #0

## 2023-05-08 ENCOUNTER — Other Ambulatory Visit: Payer: Self-pay

## 2023-05-08 ENCOUNTER — Other Ambulatory Visit (HOSPITAL_COMMUNITY): Payer: Self-pay

## 2023-05-10 ENCOUNTER — Other Ambulatory Visit (HOSPITAL_COMMUNITY): Payer: Self-pay

## 2023-05-11 ENCOUNTER — Other Ambulatory Visit (HOSPITAL_COMMUNITY): Payer: Self-pay

## 2023-05-13 ENCOUNTER — Encounter: Payer: Self-pay | Admitting: Podiatry

## 2023-05-13 ENCOUNTER — Ambulatory Visit (INDEPENDENT_AMBULATORY_CARE_PROVIDER_SITE_OTHER): Payer: PPO | Admitting: Podiatry

## 2023-05-13 DIAGNOSIS — B351 Tinea unguium: Secondary | ICD-10-CM | POA: Diagnosis not present

## 2023-05-13 DIAGNOSIS — E1159 Type 2 diabetes mellitus with other circulatory complications: Secondary | ICD-10-CM

## 2023-05-13 DIAGNOSIS — M79676 Pain in unspecified toe(s): Secondary | ICD-10-CM

## 2023-05-13 NOTE — Progress Notes (Signed)
This patient returns to my office for at risk foot care.  This patient requires this care by a professional since this patient will be at risk due to having type 2 diabetes and leg swelling.  This patient is unable to cut nails himself since the patient cannot reach his nails.These nails are painful walking and wearing shoes.  This patient presents for at risk foot care today.  General Appearance  Alert, conversant and in no acute stress.  Vascular  Dorsalis pedis and posterior tibial  pulses are palpable  left foot.  Dorsalis pedis and posterior tibial pulses are weakly palpable due to leg swelling..  Capillary return is within normal limits  bilaterally. Temperature is within normal limits  bilaterally.  Neurologic  Senn-Weinstein monofilament wire test within normal limits  bilaterally. Muscle power within normal limits bilaterally.  Nails Thick disfigured discolored nails with subungual debris  from hallux to fifth toes bilaterally. No evidence of bacterial infection or drainage bilaterally.  Orthopedic  No limitations of motion  feet .  No crepitus or effusions noted.  No bony pathology or digital deformities noted.  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  Skin  normotropic skin with no porokeratosis noted bilaterally.  No signs of infections or ulcers noted.   Asymptomatic pinch callus right foot.  Onychomycosis  Pain in right toes  Pain in left toes  Consent was obtained for treatment procedures.   Mechanical debridement of nails 1-5  bilaterally performed with a nail nipper.  Filed with dremel without incident.     Return office visit   3 months                  Told patient to return for periodic foot care and evaluation due to potential at risk complications.   Helane Gunther DPM

## 2023-05-14 ENCOUNTER — Ambulatory Visit: Payer: PPO | Admitting: Podiatry

## 2023-05-15 ENCOUNTER — Other Ambulatory Visit (HOSPITAL_COMMUNITY): Payer: Self-pay

## 2023-05-15 ENCOUNTER — Other Ambulatory Visit: Payer: Self-pay

## 2023-05-15 MED ORDER — ONETOUCH ULTRA 2 W/DEVICE KIT
PACK | 0 refills | Status: AC
  Filled 2023-05-15: qty 1, 30d supply, fill #0

## 2023-05-15 MED ORDER — ONETOUCH ULTRA TEST VI STRP
ORAL_STRIP | 3 refills | Status: AC
Start: 2023-05-10 — End: ?
  Filled 2023-05-15: qty 100, 100d supply, fill #0
  Filled 2023-09-26: qty 100, 100d supply, fill #1
  Filled 2024-03-06: qty 100, 100d supply, fill #2

## 2023-05-15 MED ORDER — ONETOUCH DELICA PLUS LANCET33G MISC
3 refills | Status: AC
  Filled 2023-05-15: qty 100, 100d supply, fill #0
  Filled 2023-09-26: qty 100, 100d supply, fill #1
  Filled 2024-03-06: qty 100, 100d supply, fill #2

## 2023-05-17 ENCOUNTER — Other Ambulatory Visit (HOSPITAL_COMMUNITY): Payer: Self-pay

## 2023-05-20 ENCOUNTER — Other Ambulatory Visit: Payer: Self-pay

## 2023-05-21 ENCOUNTER — Other Ambulatory Visit: Payer: Self-pay

## 2023-05-21 ENCOUNTER — Other Ambulatory Visit (HOSPITAL_COMMUNITY): Payer: Self-pay

## 2023-05-22 ENCOUNTER — Other Ambulatory Visit (HOSPITAL_COMMUNITY): Payer: Self-pay

## 2023-05-26 DIAGNOSIS — G4733 Obstructive sleep apnea (adult) (pediatric): Secondary | ICD-10-CM | POA: Diagnosis not present

## 2023-05-26 IMAGING — CT CT ANGIO CHEST
2 of 7 series · 17 of 46 positions shown · IV contrast (omnipaque)
Comparison: cxr06/02/20.

CLINICAL DATA: PE suspected, high prob. tolerated w/o complication
or reaction Pt arrives to ED with c/o right right leg x1 week. Pt
reports he had an outpatient US that showed an acute DVT in the
right leg.

EXAM:
CT ANGIOGRAPHY CHEST WITH CONTRAST
TECHNIQUE: Multidetector CT imaging of the chest was performed using the
standard protocol during bolus administration of intravenous
contrast. Multiplanar CT image reconstructions and MIPs were
obtained to evaluate the vascular anatomy.
CONTRAST:  64mL OMNIPAQUE IOHEXOL 350 MG/ML SOLN

[Series 5: pe axial thins · axial · 0.86mm/px · z∈[+868,+1160]mm · 14 of 334 slices shown]
[im 21/334  lung]
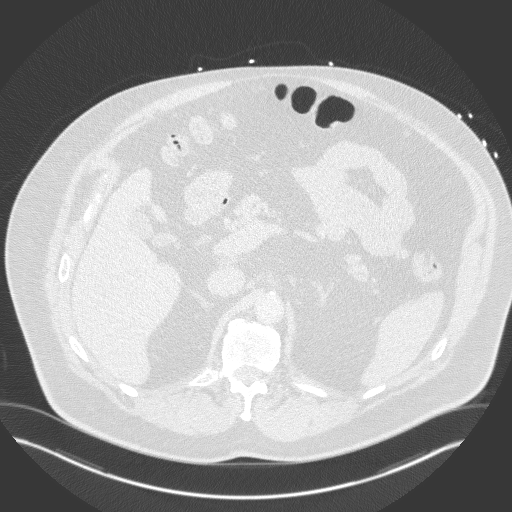
[im 42/334  soft-tissue]
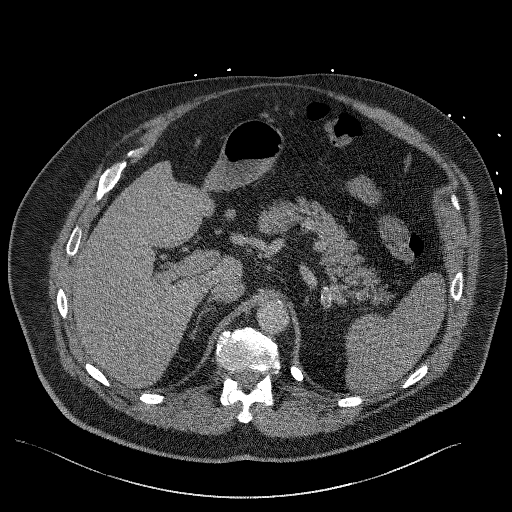
[im 63/334  lung]
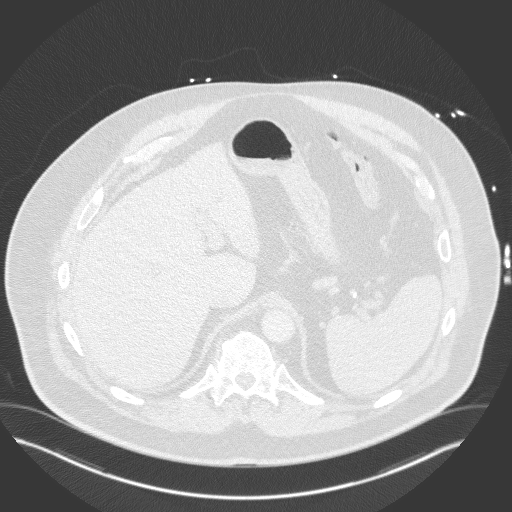
[im 84/334  soft-tissue]
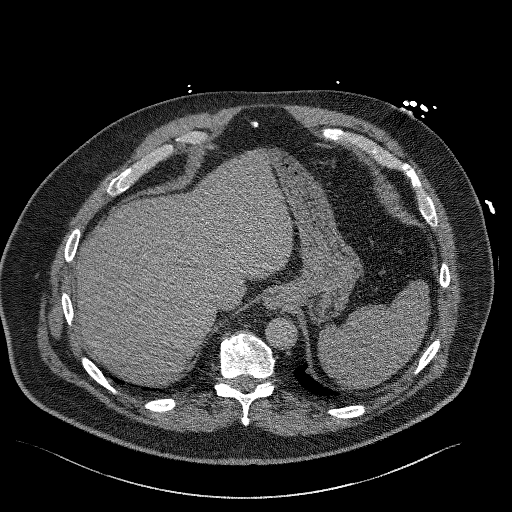
[im 105/334  lung]
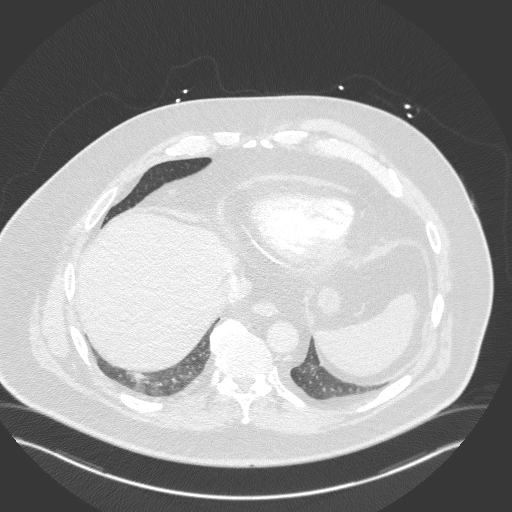
[im 125/334  soft-tissue]
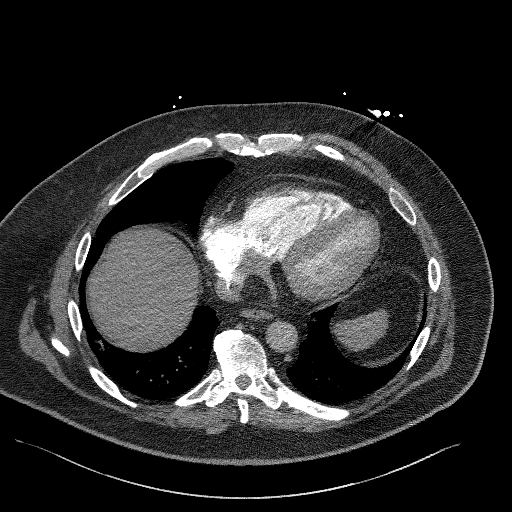
[im 146/334  lung]
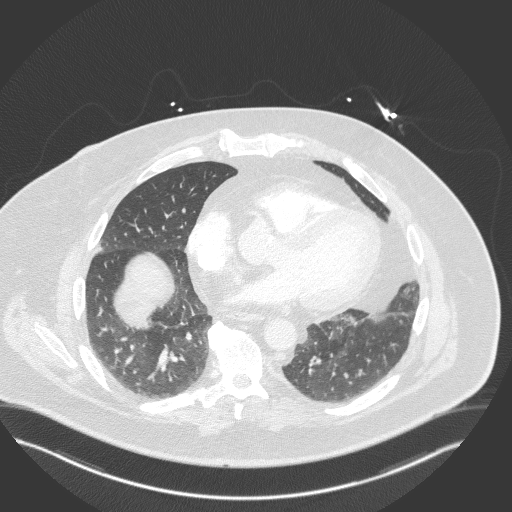
[im 188/334  soft-tissue]
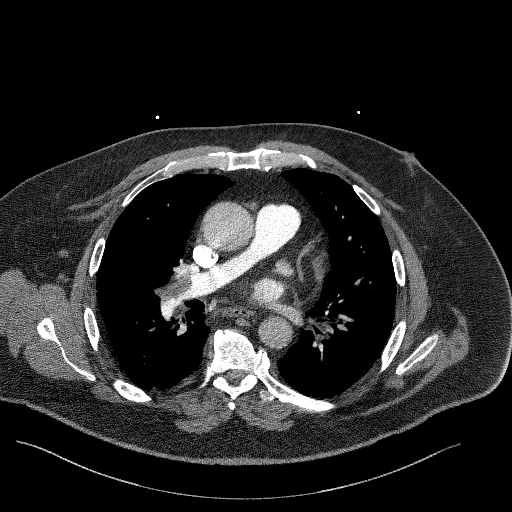
[im 209/334  lung]
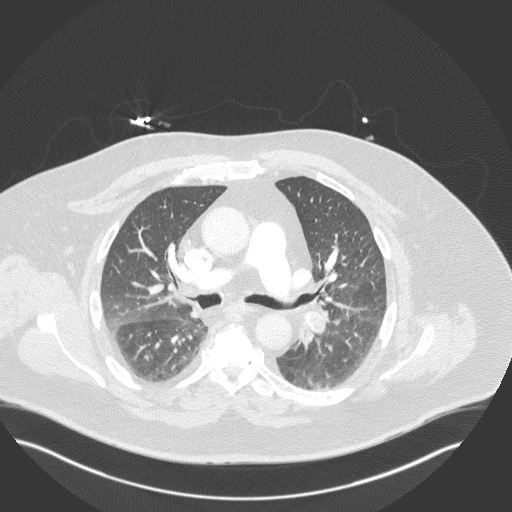
[im 229/334  soft-tissue]
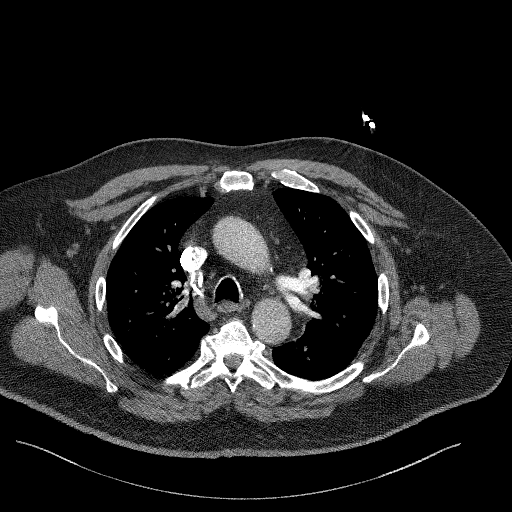
[im 250/334  lung]
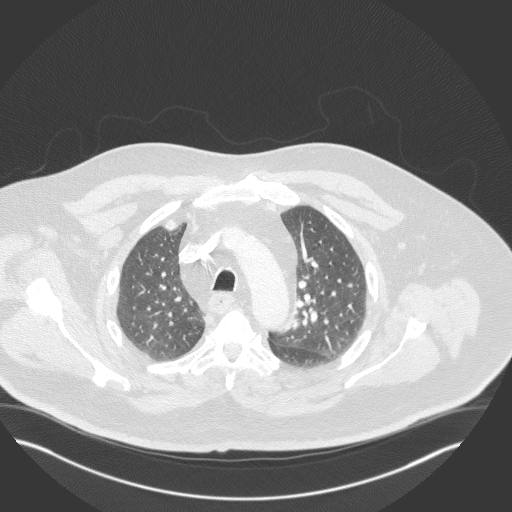
[im 271/334  soft-tissue]
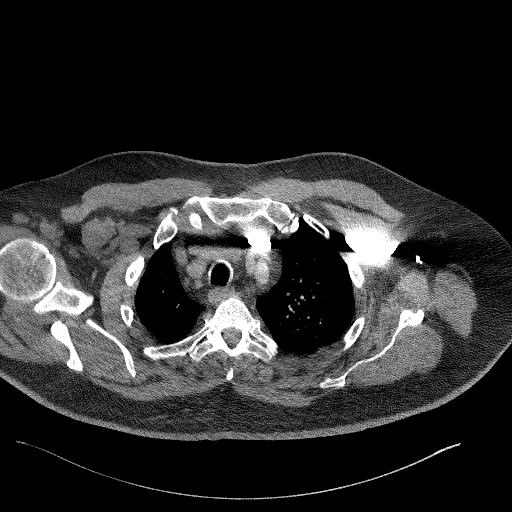
[im 292/334  lung]
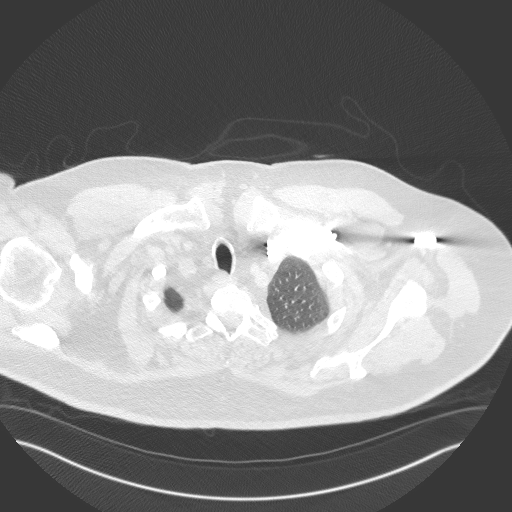
[im 313/334  soft-tissue]
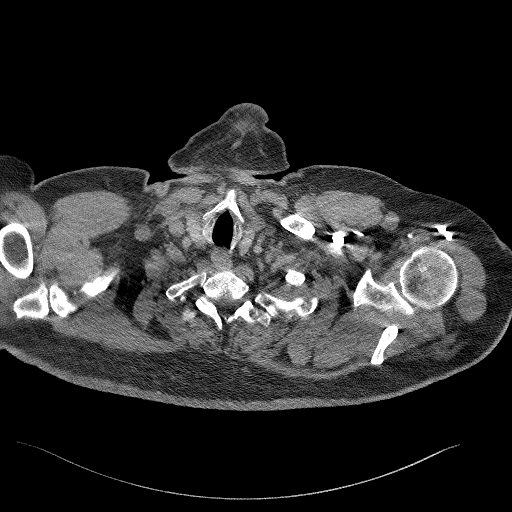

[Series 7: cor soft · coronal · 0.73mm/px · 3 of 162 slices shown]
[im 41/162  soft-tissue]
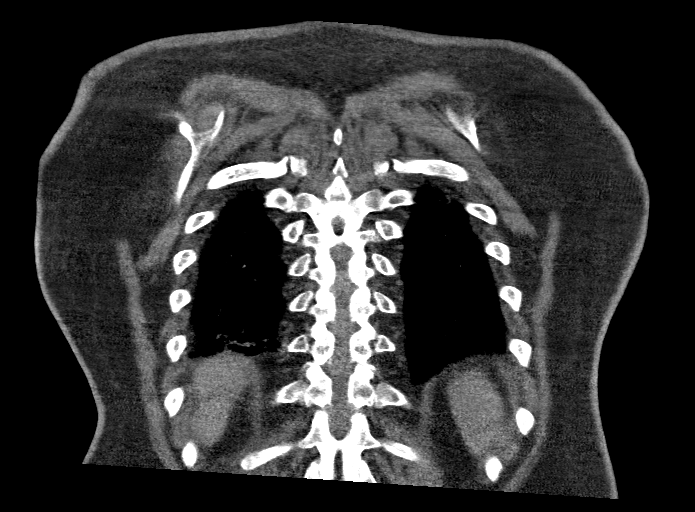
[im 81/162  soft-tissue]
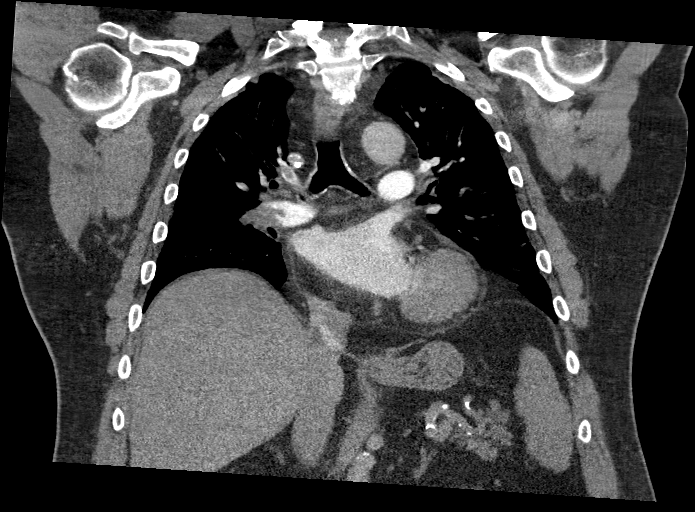
[im 121/162  soft-tissue]
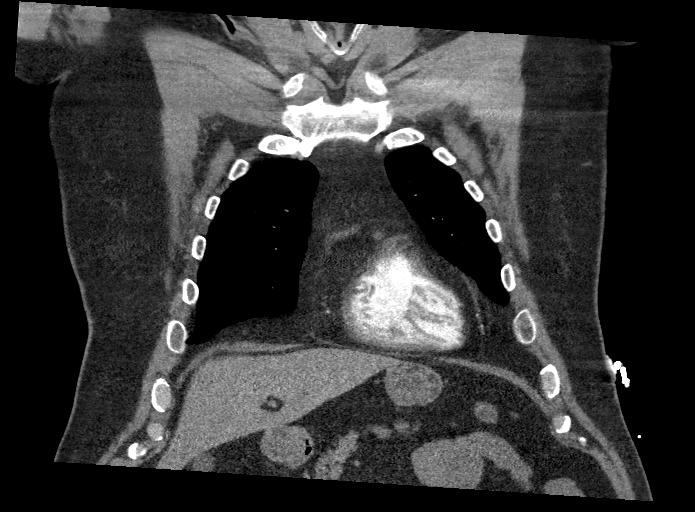

[17 of 46 positions shown; findings below may reference images not displayed]

FINDINGS: Cardiovascular: Satisfactory opacification of the pulmonary arteries
to the segmental level. Segmental and subsegmental pulmonary emboli
of all 5 lobes. Enlarged right to left ventricular ratio of 1.1.
Straightening of the interventricular septum. Otherwise normal heart
size. No pericardial effusion. Mild atherosclerotic plaque.
Four-vessel coronary calcifications.

Mediastinum/Nodes: No enlarged mediastinal, hilar, or axillary lymph
nodes. Thyroid gland, trachea, and esophagus demonstrate no
significant findings.

Lungs/Pleura: There is a 1 x 0.7 cm subpleural nodule within the
right upper lobe. There is a 1.2 x 0.9 cm subpleural right lower
lobe pulmonary ([DATE]). No focal consolidation. No pulmonary nodule.
No pulmonary mass. No pleural effusion. No pneumothorax.

Upper Abdomen: No acute abnormality.

Musculoskeletal:

No chest wall abnormality.

No suspicious lytic or blastic osseous lesions. No acute displaced
fracture. Multilevel degenerative changes of the spine.

Review of the MIP images confirms the above findings.
IMPRESSION: 1. Segmental and subsegmental pulmonary emboli of all 5 lobes.
Associated developing right heart strain. No pulmonary infarction.
Positive for acute PE with CT evidence of right heart strain (RV/LV
Ratio = 1.1) consistent with at least submassive (intermediate risk)
PE. The presence of right heart strain has been associated with an
increased risk of morbidity and mortality. Please refer to the "PE
Focused" order set in [REDACTED].
2. Couple of subcentimeter subpleural nodule within the right lung.
Non-contrast chest CT at 3-6 months is recommended. If the nodules
are stable at time of repeat CT, then future CT at 18-24 months
(from today's scan) is considered optional for low-risk patients,
but is recommended for high-risk patients. This recommendation
follows the consensus statement: Guidelines for Management of
Incidental Pulmonary Nodules Detected on CT Images: From the
3.  Aortic Atherosclerosis (U7SVY-0GI.I).

These results were called by telephone at the time of interpretation
on 04/28/2021 at [DATE] to provider DESI CAINE , who verbally
acknowledged these results.

## 2023-05-26 IMAGING — US US EXTREM LOW VENOUS*R*
1 series · 14 of 24 positions shown · non-contrast
Comparison: None.

CLINICAL DATA: Edema, pain

EXAM:
RIGHT LOWER EXTREMITY VENOUS DOPPLER ULTRASOUND
TECHNIQUE: Gray-scale sonography with compression, as well as color and duplex
ultrasound, were performed to evaluate the deep venous system(s)
from the level of the common femoral vein through the popliteal and
proximal calf veins.

[Series 1: us venous img lower uni right (dvt) · portal-venous · 14 of 56 slices shown]
[im 1/56]
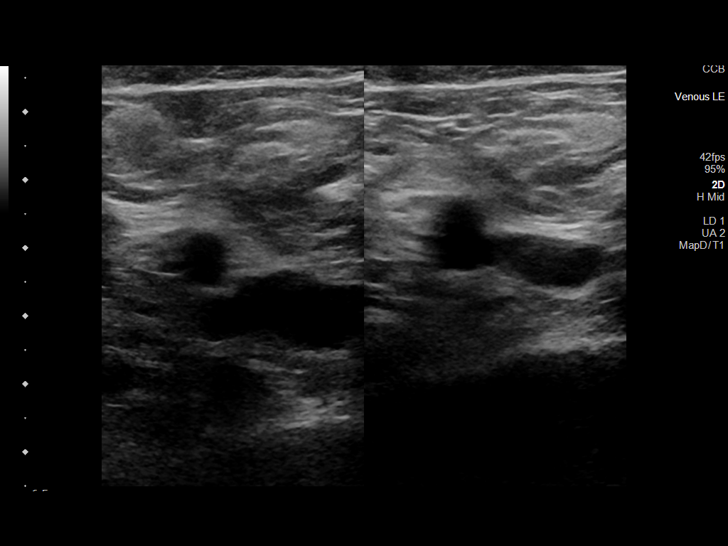
[im 5/56]
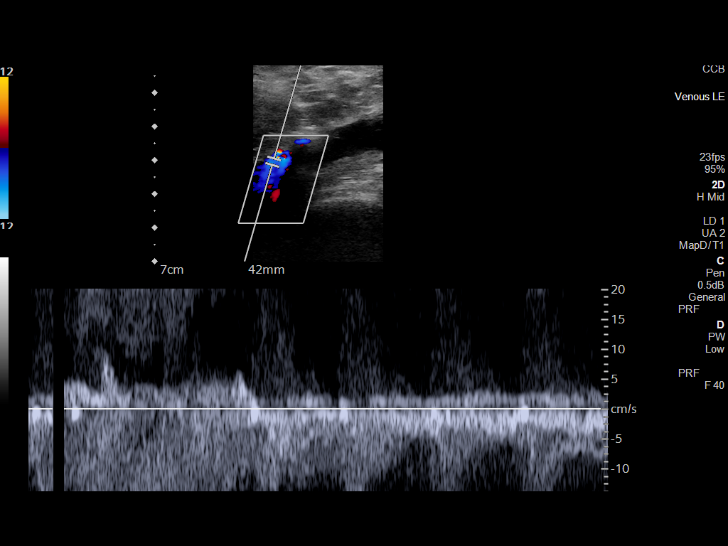
[im 10/56]
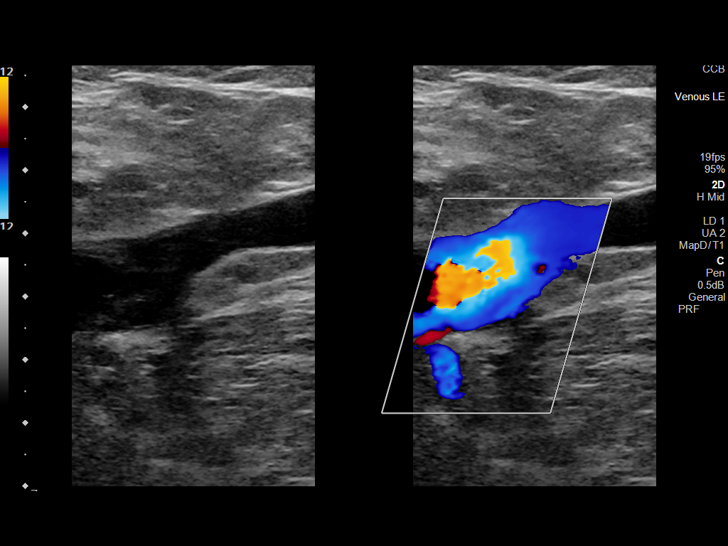
[im 15/56]
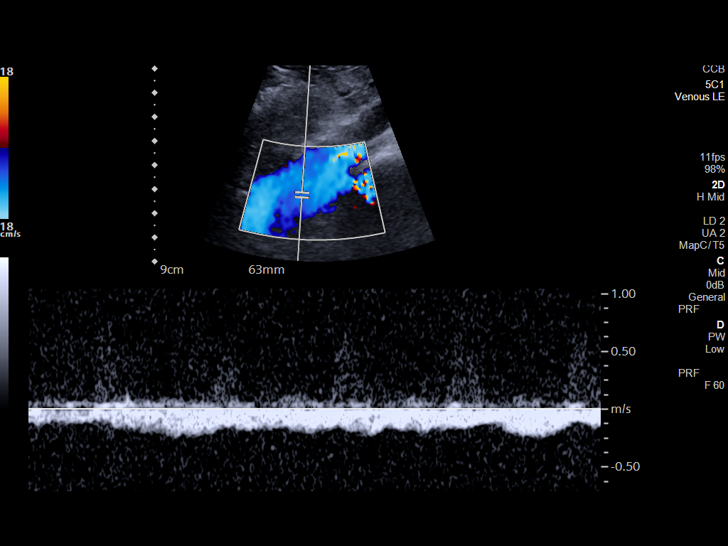
[im 17/56]
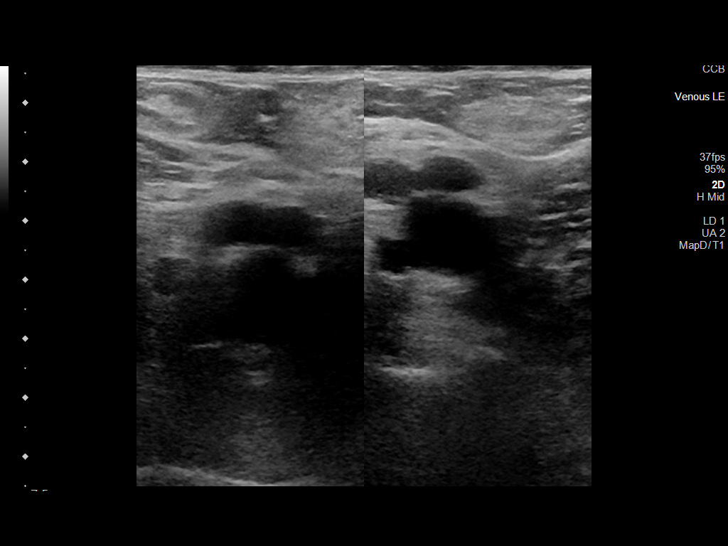
[im 22/56]
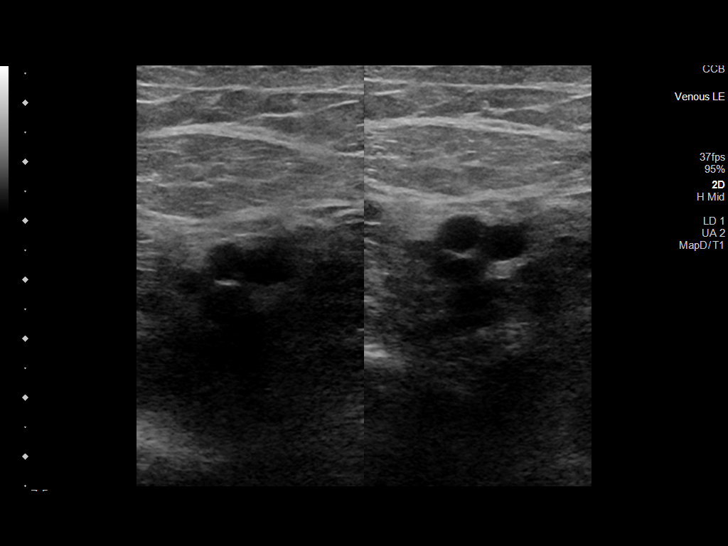
[im 27/56]
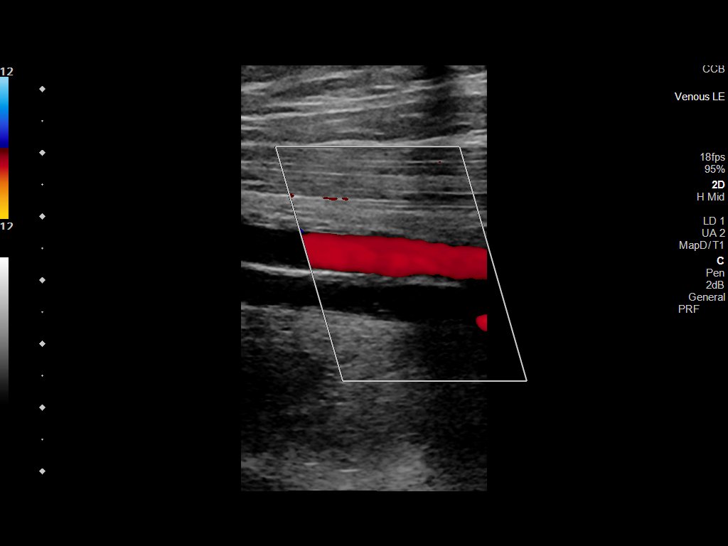
[im 29/56]
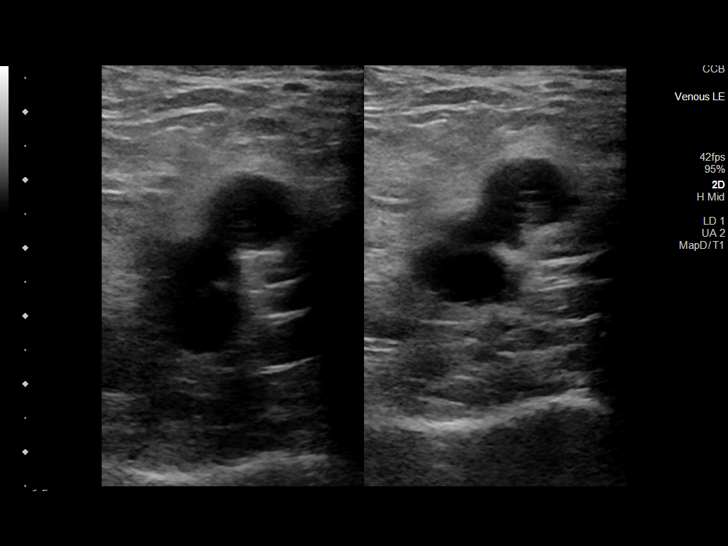
[im 34/56]
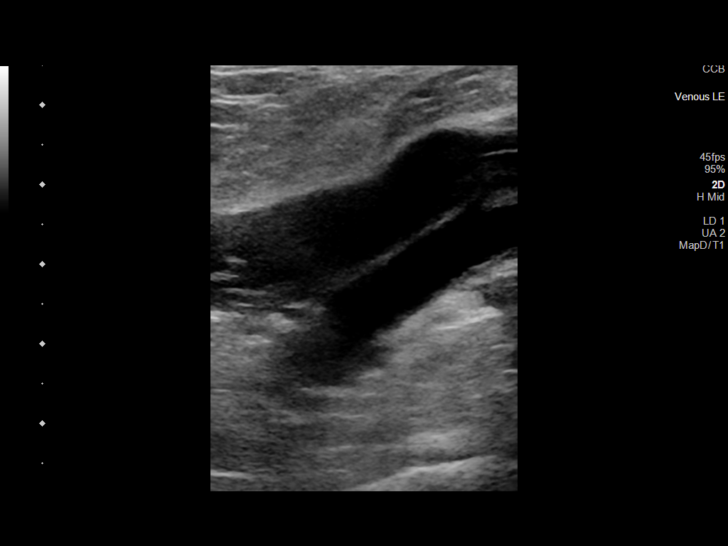
[im 39/56]
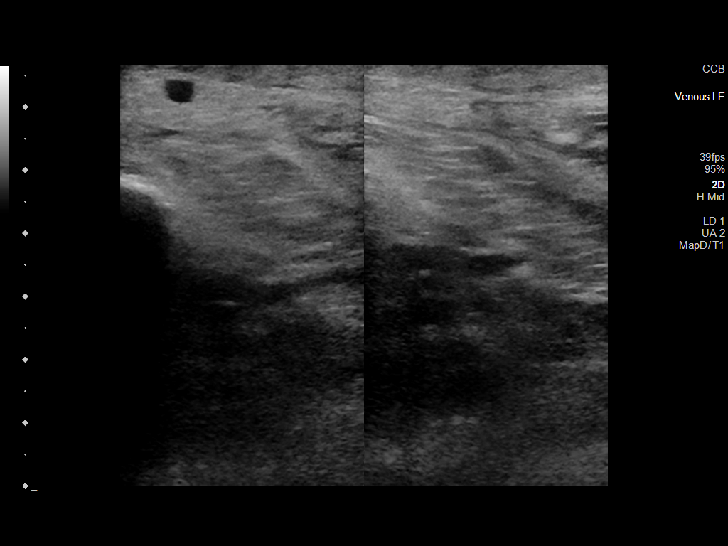
[im 44/56]
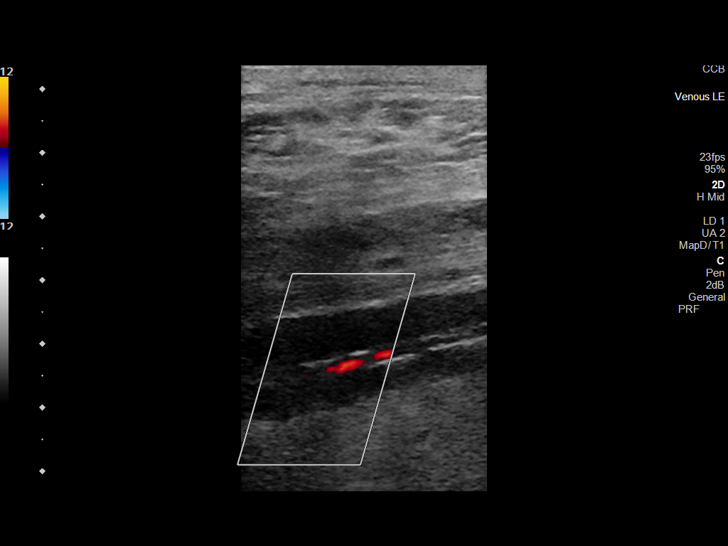
[im 46/56]
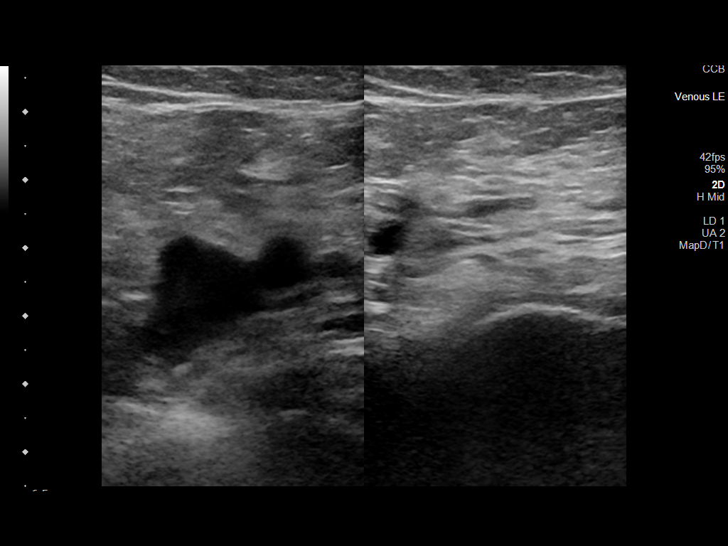
[im 51/56]
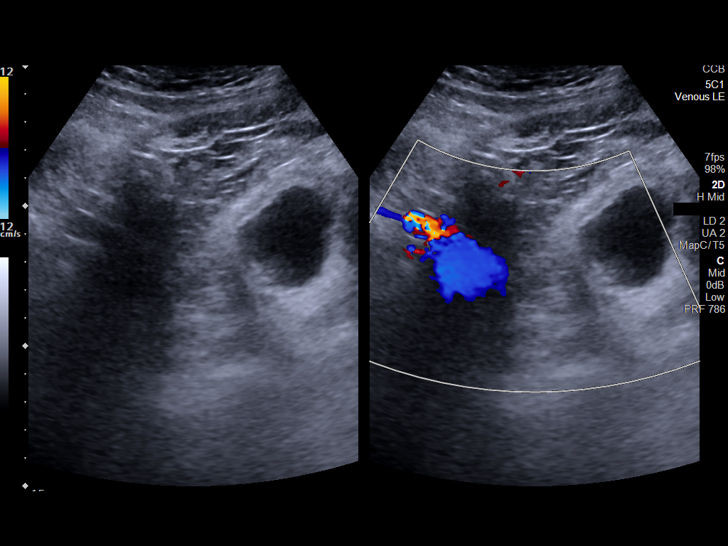
[im 56/56]
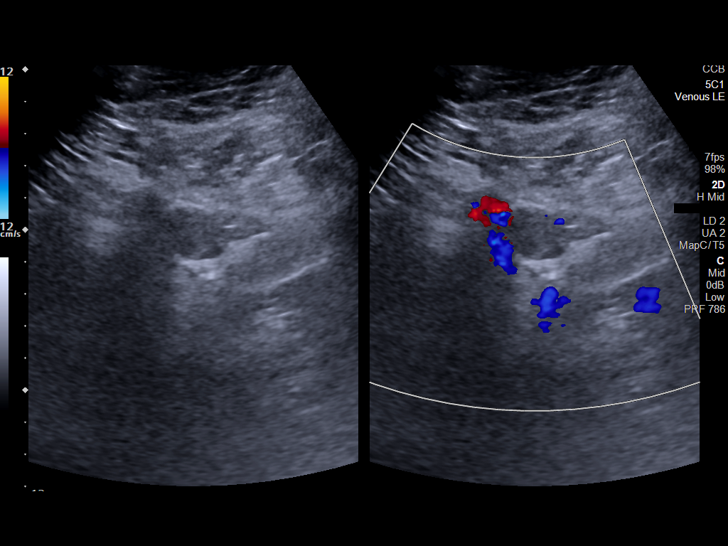

[14 of 24 positions shown; findings below may reference images not displayed]

FINDINGS: VENOUS

Hypoechoic heterogenous intraluminal thrombus in the common femoral
vein. The saphenofemoral junction remains patent. There is
hypoechoic occlusive thrombus in the deep femoral vein. Hypoechoic
non compressible thrombus through the femoral and popliteal veins
with limited flow signal on color Doppler. Occlusive thrombus in
posterior tibial and peroneal veins in the calf.

Antegrade color flow signal noted in the visualized portion of the
right external iliac vein.

Limited views of the contralateral common femoral vein are
unremarkable.

OTHER

None.

Limitations: none
IMPRESSION: 1. POSITIVE for right lower extremity acute DVT extending from
posterior tibial and peroneal calf veins through the popliteal and
femoral veins as above.

## 2023-06-05 ENCOUNTER — Other Ambulatory Visit (HOSPITAL_COMMUNITY): Payer: Self-pay

## 2023-06-05 DIAGNOSIS — J309 Allergic rhinitis, unspecified: Secondary | ICD-10-CM | POA: Diagnosis not present

## 2023-06-05 DIAGNOSIS — Z6837 Body mass index (BMI) 37.0-37.9, adult: Secondary | ICD-10-CM | POA: Diagnosis not present

## 2023-06-05 DIAGNOSIS — Z1388 Encounter for screening for disorder due to exposure to contaminants: Secondary | ICD-10-CM | POA: Diagnosis not present

## 2023-06-06 ENCOUNTER — Other Ambulatory Visit (HOSPITAL_COMMUNITY): Payer: Self-pay

## 2023-06-06 ENCOUNTER — Other Ambulatory Visit: Payer: Self-pay

## 2023-06-06 MED ORDER — PREDNISONE 10 MG (21) PO TBPK
ORAL_TABLET | ORAL | 0 refills | Status: AC
  Filled 2023-06-06: qty 21, 6d supply, fill #0

## 2023-06-10 ENCOUNTER — Other Ambulatory Visit (HOSPITAL_COMMUNITY): Payer: Self-pay

## 2023-06-11 ENCOUNTER — Other Ambulatory Visit (HOSPITAL_COMMUNITY): Payer: Self-pay

## 2023-06-11 ENCOUNTER — Other Ambulatory Visit: Payer: Self-pay

## 2023-06-11 MED ORDER — FREESTYLE LIBRE 3 SENSOR MISC
0 refills | Status: DC
  Filled 2023-06-11: qty 6, 84d supply, fill #0

## 2023-06-11 MED ORDER — FREESTYLE LIBRE 3 READER DEVI
0 refills | Status: AC
  Filled 2023-06-11: qty 1, 30d supply, fill #0

## 2023-06-11 MED ORDER — OZEMPIC (1 MG/DOSE) 4 MG/3ML ~~LOC~~ SOPN
1.0000 mg | PEN_INJECTOR | SUBCUTANEOUS | 0 refills | Status: DC
  Filled 2023-06-11: qty 3, 28d supply, fill #0

## 2023-06-12 ENCOUNTER — Other Ambulatory Visit: Payer: Self-pay

## 2023-06-12 ENCOUNTER — Other Ambulatory Visit (HOSPITAL_COMMUNITY): Payer: Self-pay

## 2023-06-13 ENCOUNTER — Other Ambulatory Visit (HOSPITAL_COMMUNITY): Payer: Self-pay

## 2023-06-13 ENCOUNTER — Other Ambulatory Visit: Payer: Self-pay

## 2023-06-17 ENCOUNTER — Other Ambulatory Visit (HOSPITAL_COMMUNITY): Payer: Self-pay

## 2023-06-21 ENCOUNTER — Ambulatory Visit (AMBULATORY_SURGERY_CENTER): Payer: PPO

## 2023-06-21 ENCOUNTER — Other Ambulatory Visit: Payer: Self-pay

## 2023-06-21 ENCOUNTER — Other Ambulatory Visit (HOSPITAL_COMMUNITY): Payer: Self-pay

## 2023-06-21 VITALS — Ht 68.5 in | Wt 244.0 lb

## 2023-06-21 DIAGNOSIS — Z1211 Encounter for screening for malignant neoplasm of colon: Secondary | ICD-10-CM

## 2023-06-21 DIAGNOSIS — Z860100 Personal history of colon polyps, unspecified: Secondary | ICD-10-CM

## 2023-06-21 MED ORDER — NA SULFATE-K SULFATE-MG SULF 17.5-3.13-1.6 GM/177ML PO SOLN
1.0000 | Freq: Once | ORAL | 0 refills | Status: AC
  Filled 2023-06-21: qty 354, 1d supply, fill #0

## 2023-06-21 NOTE — Progress Notes (Signed)

## 2023-06-25 DIAGNOSIS — G4733 Obstructive sleep apnea (adult) (pediatric): Secondary | ICD-10-CM | POA: Diagnosis not present

## 2023-06-28 ENCOUNTER — Encounter (INDEPENDENT_AMBULATORY_CARE_PROVIDER_SITE_OTHER): Payer: Self-pay | Admitting: Otolaryngology

## 2023-07-01 ENCOUNTER — Other Ambulatory Visit (HOSPITAL_COMMUNITY): Payer: Self-pay

## 2023-07-02 ENCOUNTER — Other Ambulatory Visit: Payer: Self-pay

## 2023-07-02 ENCOUNTER — Other Ambulatory Visit (HOSPITAL_COMMUNITY): Payer: Self-pay

## 2023-07-02 MED ORDER — METFORMIN HCL 1000 MG PO TABS
1000.0000 mg | ORAL_TABLET | Freq: Two times a day (BID) | ORAL | 1 refills | Status: DC
  Filled 2023-07-02: qty 180, 90d supply, fill #0
  Filled 2023-09-26: qty 180, 90d supply, fill #1

## 2023-07-04 ENCOUNTER — Other Ambulatory Visit: Payer: Self-pay

## 2023-07-08 ENCOUNTER — Other Ambulatory Visit (HOSPITAL_COMMUNITY): Payer: Self-pay

## 2023-07-09 ENCOUNTER — Other Ambulatory Visit (HOSPITAL_COMMUNITY): Payer: Self-pay

## 2023-07-10 ENCOUNTER — Other Ambulatory Visit (HOSPITAL_COMMUNITY): Payer: Self-pay

## 2023-07-11 ENCOUNTER — Other Ambulatory Visit (HOSPITAL_COMMUNITY): Payer: Self-pay

## 2023-07-11 DIAGNOSIS — H0259 Other disorders affecting eyelid function: Secondary | ICD-10-CM | POA: Diagnosis not present

## 2023-07-11 DIAGNOSIS — H43812 Vitreous degeneration, left eye: Secondary | ICD-10-CM | POA: Diagnosis not present

## 2023-07-11 DIAGNOSIS — H43391 Other vitreous opacities, right eye: Secondary | ICD-10-CM | POA: Diagnosis not present

## 2023-07-11 DIAGNOSIS — D3132 Benign neoplasm of left choroid: Secondary | ICD-10-CM | POA: Diagnosis not present

## 2023-07-11 DIAGNOSIS — Z7984 Long term (current) use of oral hypoglycemic drugs: Secondary | ICD-10-CM | POA: Diagnosis not present

## 2023-07-11 DIAGNOSIS — H5202 Hypermetropia, left eye: Secondary | ICD-10-CM | POA: Diagnosis not present

## 2023-07-11 DIAGNOSIS — E119 Type 2 diabetes mellitus without complications: Secondary | ICD-10-CM | POA: Diagnosis not present

## 2023-07-11 DIAGNOSIS — H52203 Unspecified astigmatism, bilateral: Secondary | ICD-10-CM | POA: Diagnosis not present

## 2023-07-11 DIAGNOSIS — H524 Presbyopia: Secondary | ICD-10-CM | POA: Diagnosis not present

## 2023-07-11 DIAGNOSIS — H2513 Age-related nuclear cataract, bilateral: Secondary | ICD-10-CM | POA: Diagnosis not present

## 2023-07-11 DIAGNOSIS — H11152 Pinguecula, left eye: Secondary | ICD-10-CM | POA: Diagnosis not present

## 2023-07-11 MED ORDER — OZEMPIC (2 MG/DOSE) 8 MG/3ML ~~LOC~~ SOPN
2.0000 mg | PEN_INJECTOR | SUBCUTANEOUS | 2 refills | Status: AC
  Filled 2023-07-11: qty 3, 28d supply, fill #0
  Filled 2023-09-04: qty 3, 28d supply, fill #2

## 2023-07-17 ENCOUNTER — Telehealth (INDEPENDENT_AMBULATORY_CARE_PROVIDER_SITE_OTHER): Payer: Self-pay | Admitting: Otolaryngology

## 2023-07-17 NOTE — Telephone Encounter (Signed)
 Confirmed appt & locatioin [id number] afm

## 2023-07-18 ENCOUNTER — Ambulatory Visit (INDEPENDENT_AMBULATORY_CARE_PROVIDER_SITE_OTHER): Payer: PPO | Admitting: Otolaryngology

## 2023-07-18 ENCOUNTER — Encounter (INDEPENDENT_AMBULATORY_CARE_PROVIDER_SITE_OTHER): Payer: Self-pay

## 2023-07-18 ENCOUNTER — Other Ambulatory Visit (HOSPITAL_COMMUNITY): Payer: Self-pay

## 2023-07-18 VITALS — BP 132/82 | HR 74 | Ht 68.5 in | Wt 244.0 lb

## 2023-07-18 DIAGNOSIS — R0981 Nasal congestion: Secondary | ICD-10-CM | POA: Diagnosis not present

## 2023-07-18 DIAGNOSIS — J343 Hypertrophy of nasal turbinates: Secondary | ICD-10-CM

## 2023-07-18 DIAGNOSIS — R0982 Postnasal drip: Secondary | ICD-10-CM

## 2023-07-18 DIAGNOSIS — J310 Chronic rhinitis: Secondary | ICD-10-CM | POA: Diagnosis not present

## 2023-07-18 MED ORDER — AZELASTINE HCL 0.1 % NA SOLN
2.0000 | Freq: Two times a day (BID) | NASAL | 12 refills | Status: AC
  Filled 2023-07-18: qty 30, 30d supply, fill #0
  Filled 2023-08-07: qty 30, 30d supply, fill #1
  Filled 2023-09-26: qty 30, 30d supply, fill #2
  Filled 2023-10-21: qty 30, 30d supply, fill #3
  Filled 2023-12-09: qty 30, 30d supply, fill #4
  Filled 2024-03-06: qty 30, 30d supply, fill #5
  Filled 2024-06-23: qty 30, 30d supply, fill #6

## 2023-07-18 NOTE — Progress Notes (Signed)
Patient ID: Eric Campbell, male   DOB: April 03, 1951, 73 y.o.   MRN: [id number]  CC: Chronic nasal congestion, postnasal drainage, sneezing spells  HPI:  Eric Campbell is a 73 y.o. male who presents today complaining of chronic nasal congestion, chronic nasal drainage, and frequent sneezing spells.  According to the patient, his symptoms started in November 2024.  He has a history of environmental allergies.  He is currently on Allegra and Flonase nasal spray daily.  He also uses nasal saline irrigation daily.  He is sneezing spells have decreased.  However, he continues to have frequent nasal drainage.  He currently denies any facial pain, fever, or visual change.  He has no previous ENT surgery.  Past Medical History:  Diagnosis Date   Allergic rhinitis    Allergy    Asthma    BPH (benign prostatic hyperplasia)    Colon polyps    Congenital hearing loss    Diabetes mellitus    diet controlled   Dyslipidemia    ED (erectile dysfunction)    Hyperlipidemia    Hypertension    Hypogonadism male    Obesity    Osteopenia    Seasonal allergies    Sleep apnea     wears cpap   Torn ligament    left ankle   Vitamin D deficiency     Past Surgical History:  Procedure Laterality Date   ACHILLES TENDON SURGERY Left    ankle   COLONOSCOPY     POLYPECTOMY     UMBILICAL HERNIA REPAIR  1998    Family History  Problem Relation Age of Onset   Pancreatic cancer Mother 44   COPD Mother    Emphysema Mother    Other Mother        had Colostomy-    COPD Father    Heart attack Father    Emphysema Father    Diabetes Sister    Esophageal cancer Brother    Cancer Brother 26       type unknown   Ulcerative colitis Grandchild 16   Colon cancer Neg Hx    Stomach cancer Neg Hx    Rectal cancer Neg Hx    Colon polyps Neg Hx     Social History:  reports that he has never smoked. He has never used smokeless tobacco. He reports that he does not currently use alcohol. He reports that he does  not use drugs.  Allergies:  Allergies  Allergen Reactions   Lotrisone [Clotrimazole-Betamethasone] Rash   Penicillins Hives and Swelling   Cephalexin Swelling    Prior to Admission medications   Medication Sig Start Date End Date Taking? Authorizing Provider  albuterol (PROVENTIL HFA;VENTOLIN HFA) 108 (90 BASE) MCG/ACT inhaler Inhale 2 puffs into the lungs every 4 (four) hours as needed for wheezing or shortness of breath.   Yes [provider]  alclomethasone (ACLOVATE) 0.05 % cream Apply 1 application topically 2 (two) times daily. 01/07/20  Yes [provider]  aspirin 81 MG chewable tablet Chew by mouth. 12/02/15  Yes [provider]  atorvastatin (LIPITOR) 20 MG tablet Take 1 tablet (20 mg total) by mouth daily for cholesterol 04/04/23  Yes   augmented betamethasone dipropionate (DIPROLENE-AF) 0.05 % cream Apply 1 application topically in the morning and at bedtime. 01/07/20  Yes [provider]  azelastine (ASTELIN) 0.1 % nasal spray Place 2 sprays into both nostrils 2 (two) times daily. Use in each nostril as directed 07/18/23 08/17/23 Yes Kathline Banbury,  Pricilla Moehle, MD  B Complex-Folic Acid (B COMPLEX-VITAMIN B12 PO) Take 2-3 tablets by mouth daily.   Yes [provider]  Blood Glucose Monitoring Suppl (ONE TOUCH ULTRA 2) w/Device KIT Use as directed to check blood glucose once a day. 05/10/23  Yes Tally Joe, MD  budesonide-formoterol (SYMBICORT) 80-4.5 MCG/ACT inhaler 2 puffs in the morning right when you wake up, rinse out your mouth after use, 12 hours later 2 puffs, rinse after use.Take this daily, no matter what.This is not a rescue inhaler 06/02/20  Yes Coral Ceo, NP  Calcium Carbonate 1500 (600 CA) MG TABS Take 1 tablet by mouth daily.    Yes [provider]  Continuous Blood Gluc Sensor (FREESTYLE LIBRE 14 DAY SENSOR) MISC as directed every 2 weeks   Yes [provider]  Continuous Blood Gluc Sensor (FREESTYLE LIBRE 14 DAY SENSOR) MISC  Apply topically every 14 (fourteen) days. 04/12/20  Yes [provider]  Continuous Blood Gluc Sensor (FREESTYLE LIBRE 14 DAY SENSOR) MISC Apply new sensor every 14 days. Use to monitor blood glucose continuously. Remove old sensor before applying new one 04/09/22  Yes   Continuous Glucose Receiver (FREESTYLE LIBRE 3 READER) DEVI Use as directed to check blood sugar. 06/11/23  Yes   Continuous Glucose Sensor (FREESTYLE LIBRE 2 SENSOR) MISC Apply one sensor to the back of the upper arm every 14 days 04/11/23  Yes   Continuous Glucose Sensor (FREESTYLE LIBRE 3 SENSOR) MISC Use every 14 days as directed to the back of the upper arm 06/11/23  Yes   glucose blood (ONETOUCH ULTRA TEST) test strip Use as directed to check blood glucose once a day. 05/10/23  Yes Tally Joe, MD  glucose blood test strip Use to test blood sugar once daily as directed 02/20/22  Yes   Lancets (ONETOUCH DELICA PLUS LANCET33G) MISC Use once daily as directed 02/19/22  Yes   Lancets (ONETOUCH DELICA PLUS LANCET33G) MISC Use as directed to check blood glucose once a day 05/10/23  Yes   losartan (COZAAR) 100 MG tablet Take 1 tablet (100 mg total) by mouth daily for blood pressure 10/01/22  Yes   losartan (COZAAR) 100 MG tablet Take 1 tablet (100 mg total) by mouth daily for blood pressure 04/04/23  Yes   Magnesium 250 MG TABS Take 1 tablet by mouth 2 (two) times daily.   Yes [provider]  metFORMIN (GLUCOPHAGE) 1000 MG tablet Take 1,000 mg by mouth 2 (two) times daily. 04/12/20  Yes [provider]  metFORMIN (GLUCOPHAGE) 1000 MG tablet Take 1 tablet (1,000 mg total) by mouth 2 (two) times daily with a meal for diabetes. 07/02/23  Yes   Omega-3 Fatty Acids (FISH OIL) 1000 MG CAPS Take 1 capsule by mouth daily.   Yes [provider]  OneTouch Delica Lancets 33G MISC TEST BLOOD GLUCOSE ONCE DAILY/ DX E11.65   Yes [provider]  Semaglutide, 1 MG/DOSE, (OZEMPIC, 1 MG/DOSE,) 4 MG/3ML SOPN  Inject 1 mg into the skin every 7 (seven) days. 06/11/23  Yes   Semaglutide, 2 MG/DOSE, (OZEMPIC, 2 MG/DOSE,) 8 MG/3ML SOPN Inject 2 mg into the skin every 7 (seven) days. 07/11/23  Yes   Semaglutide,0.25 or 0.5MG /DOS, (OZEMPIC, 0.25 OR 0.5 MG/DOSE,) 2 MG/3ML SOPN Inject 0.5 mg into the skin once a week. 05/07/23  Yes   SHINGRIX injection  08/01/20  Yes [provider]  sildenafil (VIAGRA) 100 MG tablet Take by mouth. 12/02/15  Yes [provider]  tiZANidine (ZANAFLEX) 2 MG tablet 1 tablet as needed for   Yes [provider]  triamcinolone cream (KENALOG) 0.1 % Apply 1 application topically daily as needed for rash. 04/04/21  Yes [provider]  Vitamin D, Ergocalciferol, 2000 units CAPS Take 2 capsules by mouth 2 (two) times daily.   Yes [provider]    Blood pressure 132/82, pulse 74, height 5' 8.5" (1.74 m), weight 244 lb (110.7 kg), SpO2 90%. Exam: General: Communicates without difficulty, well nourished, no acute distress. Head: Normocephalic, no evidence injury, no tenderness, facial buttresses intact without stepoff. Face/sinus: No tenderness to palpation and percussion. Facial movement is normal and symmetric. Eyes: PERRL, EOMI. No scleral icterus, conjunctivae clear. Neuro: CN II exam reveals vision grossly intact.  No nystagmus at any point of gaze. Ears: Auricles well formed without lesions.  Ear canals are intact without mass or lesion.  No erythema or edema is appreciated.  The TMs are intact without fluid. Nose: External evaluation reveals normal support and skin without lesions.  Dorsum is intact.  Anterior rhinoscopy reveals congested mucosa over anterior aspect of inferior turbinates and intact septum.  No purulence noted. Oral:  Oral cavity and oropharynx are intact, symmetric, without erythema or edema.  Mucosa is moist without lesions. Neck: Full range of motion without pain.  There is no significant lymphadenopathy.  No masses palpable.   Thyroid bed within normal limits to palpation.  Parotid glands and submandibular glands equal bilaterally without mass.  Trachea is midline. Neuro:  CN 2-12 grossly intact.   Procedure:  Flexible Nasal Endoscopy: Description: Risks, benefits, and alternatives of flexible endoscopy were explained to the patient.  Specific mention was made of the risk of throat numbness with difficulty swallowing, possible bleeding from the nose and mouth, and pain from the procedure.  The patient gave oral consent to proceed.  The flexible scope was inserted into the right nasal cavity.  Endoscopy of the interior nasal cavity, superior, inferior, and middle meatus was performed. The sphenoid-ethmoid recess was examined. Edematous mucosa was noted.  No polyp, mass, or lesion was appreciated. Olfactory cleft was clear.  Nasopharynx with postnasal drainage.  Turbinates were hypertrophied but without mass.  The procedure was repeated on the contralateral side with similar findings.  The patient tolerated the procedure well.    Assessment: 1.  Chronic/allergic rhinitis with nasal mucosal congestion and bilateral inferior turbinate hypertrophy. 2.  Chronic postnasal drainage. 3.  No polyps, mass, lesion, or acute infection is noted today.  Plan: 1.  The physical exam and nasal endoscopy findings are reviewed with the patient. 2.  Continue with Flonase and Allegra daily. 3.  Azelastine nasal spray twice daily to treat his chronic/allergic rhinitis. 4.  Continue with nasal saline irrigation. 5.  The patient will return for reevaluation in 4 weeks.  Kallan Bischoff W Kierria Feigenbaum 07/18/2023, 1:48 PM

## 2023-07-19 DIAGNOSIS — J310 Chronic rhinitis: Secondary | ICD-10-CM | POA: Insufficient documentation

## 2023-07-19 DIAGNOSIS — R0982 Postnasal drip: Secondary | ICD-10-CM | POA: Insufficient documentation

## 2023-07-19 DIAGNOSIS — J343 Hypertrophy of nasal turbinates: Secondary | ICD-10-CM | POA: Insufficient documentation

## 2023-07-23 ENCOUNTER — Encounter: Payer: PPO | Admitting: Internal Medicine

## 2023-08-01 ENCOUNTER — Encounter: Payer: Self-pay | Admitting: Internal Medicine

## 2023-08-02 ENCOUNTER — Telehealth: Payer: Self-pay | Admitting: Internal Medicine

## 2023-08-02 NOTE — Telephone Encounter (Signed)
He should see how he's feeling on Wednesday. If better, proceed with colonoscopy on Thursday.  If not, call the office and reschedule. Thanks

## 2023-08-02 NOTE — Telephone Encounter (Signed)
Detailed message left on pt identified voicemail.

## 2023-08-02 NOTE — Telephone Encounter (Signed)
Patient called and stated that he was having sinus problems and that his PCP told him the his Nasal passages where swollen and they placed him on  a medication that has gotten rid of his sneezing but has made him build up mucus, so he is constantly blowing his nose. Patient is wanting to talk to the nurse on weather he should reschedule his procedure or if its ok for him to have it done. Patient procedure is on the 6th of Feb at 8:00.Patient is requesting a call back. Please advise.

## 2023-08-02 NOTE — Telephone Encounter (Signed)
Pt was treated for sinus infection and that has gotten better but his nasal passages are still swollen and he is producing lots of mucous. He uses a cpap and he was so congested the other night he could not wear the mask. He is not sneezing as much now but has to blow his nose quites a bit. He wants to know if he should reschedule his procedure. He does not have a fever but wants to make sure it is safe for him to proceed with the procedure. Please advise. Pt is aware Dr. Marina Goodell is out of the office and will reply when he returns.

## 2023-08-07 ENCOUNTER — Other Ambulatory Visit (HOSPITAL_COMMUNITY): Payer: Self-pay

## 2023-08-07 ENCOUNTER — Other Ambulatory Visit: Payer: Self-pay

## 2023-08-08 ENCOUNTER — Encounter: Payer: Self-pay | Admitting: Internal Medicine

## 2023-08-08 ENCOUNTER — Ambulatory Visit: Payer: PPO | Admitting: Internal Medicine

## 2023-08-08 VITALS — BP 111/63 | HR 87 | Temp 98.1°F | Resp 13 | Ht 68.5 in | Wt 244.0 lb

## 2023-08-08 DIAGNOSIS — Z1211 Encounter for screening for malignant neoplasm of colon: Secondary | ICD-10-CM

## 2023-08-08 DIAGNOSIS — K5730 Diverticulosis of large intestine without perforation or abscess without bleeding: Secondary | ICD-10-CM

## 2023-08-08 DIAGNOSIS — Z860100 Personal history of colon polyps, unspecified: Secondary | ICD-10-CM

## 2023-08-08 DIAGNOSIS — D175 Benign lipomatous neoplasm of intra-abdominal organs: Secondary | ICD-10-CM

## 2023-08-08 DIAGNOSIS — K648 Other hemorrhoids: Secondary | ICD-10-CM | POA: Diagnosis not present

## 2023-08-08 DIAGNOSIS — D124 Benign neoplasm of descending colon: Secondary | ICD-10-CM

## 2023-08-08 MED ORDER — SODIUM CHLORIDE 0.9 % IV SOLN: 500.0000 mL | Freq: Once | INTRAVENOUS | Status: DC

## 2023-08-08 NOTE — Progress Notes (Signed)
 HISTORY OF PRESENT ILLNESS:  Eric Campbell is a 73 y.o. male with a history of multiple adenomatous colon polyps.  Now for surveillance colonoscopy  REVIEW OF SYSTEMS:  All non-GI ROS negative except for  Past Medical History:  Diagnosis Date   Allergic rhinitis    Allergy    Asthma    BPH (benign prostatic hyperplasia)    Colon polyps    Congenital hearing loss    Diabetes mellitus    diet controlled   Dyslipidemia    ED (erectile dysfunction)    Hyperlipidemia    Hypertension    Hypogonadism male    Obesity    Osteopenia    Seasonal allergies    Sleep apnea     wears cpap   Torn ligament    left ankle   Vitamin D deficiency     Past Surgical History:  Procedure Laterality Date   ACHILLES TENDON SURGERY Left    ankle   COLONOSCOPY     POLYPECTOMY     UMBILICAL HERNIA REPAIR  1998    Social History [employer] [place]  reports that he has never smoked. He has never used smokeless tobacco. He reports that he does not currently use alcohol. He reports that he does not use drugs.  family history includes COPD in his father and mother; Cancer (age of onset: 69) in his brother; Diabetes in his sister; Emphysema in his father and mother; Esophageal cancer in his brother; Heart attack in his father; Other in his mother; Pancreatic cancer (age of onset: 22) in his mother; Ulcerative colitis (age of onset: 3) in his grandchild.  Allergies  Allergen Reactions   Lotrisone [Clotrimazole-Betamethasone] Rash   Penicillins Hives and Swelling   Cephalexin Swelling       PHYSICAL EXAMINATION: Vital signs: BP (!) 101/56   Pulse 79   Temp 98.1 F (36.7 C) (Temporal)   Resp (!) 28   Ht 5' 8.5 (1.74 m)   Wt 244 lb (110.7 kg)   SpO2 93%   BMI 36.56 kg/m  General: Well-developed, well-nourished, no acute distress HEENT: Sclerae are anicteric, conjunctiva pink. Oral mucosa intact Lungs: Clear Heart: Regular Abdomen: soft, nontender, nondistended, no obvious ascites, no  peritoneal signs, normal bowel sounds. No organomegaly. Extremities: No edema Psychiatric: alert and oriented x3. Cooperative     ASSESSMENT:   History of multiple adenomatous polyps  PLAN:  Surveillance colonoscopy

## 2023-08-08 NOTE — Progress Notes (Signed)
 Vss nad trans to pacu

## 2023-08-08 NOTE — Progress Notes (Signed)
 Called to room to assist during endoscopic procedure.  Patient ID and intended procedure confirmed with present staff. Received instructions for my participation in the procedure from the performing physician.

## 2023-08-08 NOTE — Patient Instructions (Signed)

## 2023-08-08 NOTE — Op Note (Signed)
 [facility] Patient Name: Eric Campbell Procedure Date: 08/08/2023 8:10 AM MRN: [id number] Endoscopist: Norleen SAILOR. Abran , MD, [id number] Age: 73 Referring MD:  Date of Birth: 1950-09-22 Gender: Male Account #: [id number] Procedure:                Colonoscopy with cold snare polypectomy x 1 Indications:              High risk colon cancer surveillance: Personal                            history of multiple (3 or more) adenomas. Previous                            examinations with Dr. Aneita 2013, 2022 Medicines:                Monitored Anesthesia Care Procedure:                Pre-Anesthesia Assessment:                           - Prior to the procedure, a History and Physical                            was performed, and patient medications and                            allergies were reviewed. The patient's tolerance of                            previous anesthesia was also reviewed. The risks                            and benefits of the procedure and the sedation                            options and risks were discussed with the patient.                            All questions were answered, and informed consent                            was obtained. Prior Anticoagulants: The patient has                            taken no anticoagulant or antiplatelet agents.                            After reviewing the risks and benefits, the patient                            was deemed in satisfactory condition to undergo the                            procedure.  After obtaining informed consent, the colonoscope                            was passed under direct vision. Throughout the                            procedure, the patient's blood pressure, pulse, and                            oxygen saturations were monitored continuously. The                            CF [id number] #[id number] was introduced through the anus                            and  advanced to the the cecum, identified by                            appendiceal orifice and ileocecal valve. The                            ileocecal valve, appendiceal orifice, and rectum                            were photographed. The quality of the bowel                            preparation was excellent. The colonoscopy was                            performed without difficulty. The patient tolerated                            the procedure well. The bowel preparation used was                            SUPREP via split dose instruction. Scope In: 8:36:56 AM Scope Out: 8:49:25 AM Scope Withdrawal Time: 0 hours 10 minutes 19 seconds  Total Procedure Duration: 0 hours 12 minutes 29 seconds  Findings:                 A 3 mm polyp was found in the descending colon. The                            polyp was removed with a cold snare. Resection and                            retrieval were complete.                           Multiple diverticula were found in the left colon.                            Incidental lipoma right colon.  Internal hemorrhoids were found during                            retroflexion. The hemorrhoids were small.                           The exam was otherwise without abnormality on                            direct and retroflexion views. Complications:            No immediate complications. Estimated blood loss:                            None. Estimated Blood Loss:     Estimated blood loss: none. Impression:               - One 3 mm polyp in the descending colon, removed                            with a cold snare. Resected and retrieved.                           - Diverticulosis in the left colon. Lipoma, right                            colon.                           - Internal hemorrhoids.                           - The examination was otherwise normal on direct                            and retroflexion  views. Recommendation:           - Repeat colonoscopy in 5 years for surveillance.                           - Patient has a contact number available for                            emergencies. The signs and symptoms of potential                            delayed complications were discussed with the                            patient. Return to normal activities tomorrow.                            Written discharge instructions were provided to the                            patient.                           -  Resume previous diet.                           - Continue present medications.                           - Await pathology results. Norleen SAILOR. Abran, MD 08/08/2023 9:07:41 AM This report has been signed electronically.

## 2023-08-09 ENCOUNTER — Telehealth: Payer: Self-pay

## 2023-08-09 NOTE — Telephone Encounter (Signed)
  Follow up Call-     08/08/2023    7:24 AM  Call back number  Post procedure Call Back phone  # [phone]  Permission to leave phone message Yes     Patient questions:  Do you have a fever, pain , or abdominal swelling? No. Pain Score  0 *  Have you tolerated food without any problems? Yes.    Have you been able to return to your normal activities? Yes.    Do you have any questions about your discharge instructions: Diet   No. Medications  No. Follow up visit  No.  Do you have questions or concerns about your Care? No.  Actions: * If pain score is 4 or above: No action needed, pain <4.

## 2023-08-11 ENCOUNTER — Other Ambulatory Visit (HOSPITAL_COMMUNITY): Payer: Self-pay

## 2023-08-13 ENCOUNTER — Ambulatory Visit (INDEPENDENT_AMBULATORY_CARE_PROVIDER_SITE_OTHER): Payer: PPO | Admitting: Podiatry

## 2023-08-13 ENCOUNTER — Encounter: Payer: Self-pay | Admitting: Podiatry

## 2023-08-13 ENCOUNTER — Encounter: Payer: Self-pay | Admitting: Internal Medicine

## 2023-08-13 DIAGNOSIS — M79676 Pain in unspecified toe(s): Secondary | ICD-10-CM | POA: Diagnosis not present

## 2023-08-13 DIAGNOSIS — E1159 Type 2 diabetes mellitus with other circulatory complications: Secondary | ICD-10-CM | POA: Diagnosis not present

## 2023-08-13 DIAGNOSIS — B351 Tinea unguium: Secondary | ICD-10-CM | POA: Diagnosis not present

## 2023-08-13 LAB — SURGICAL PATHOLOGY

## 2023-08-13 NOTE — Progress Notes (Signed)
This patient returns to my office for at risk foot care.  This patient requires this care by a professional since this patient will be at risk due to having type 2 diabetes and leg swelling.  This patient is unable to cut nails himself since the patient cannot reach his nails.These nails are painful walking and wearing shoes.  This patient presents for at risk foot care today.  General Appearance  Alert, conversant and in no acute stress.  Vascular  Dorsalis pedis and posterior tibial  pulses are palpable  left foot.  Dorsalis pedis and posterior tibial pulses are weakly palpable due to leg swelling..  Capillary return is within normal limits  bilaterally. Temperature is within normal limits  bilaterally.  Neurologic  Senn-Weinstein monofilament wire test within normal limits  bilaterally. Muscle power within normal limits bilaterally.  Nails Thick disfigured discolored nails with subungual debris  from hallux to fifth toes bilaterally. No evidence of bacterial infection or drainage bilaterally.  Orthopedic  No limitations of motion  feet .  No crepitus or effusions noted.  No bony pathology or digital deformities noted.  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  Skin  normotropic skin with no porokeratosis noted bilaterally.  No signs of infections or ulcers noted.  Healing skin lesion with scab plantarly.  No infection or drainage.  Onychomycosis  Pain in right toes  Pain in left toes  Consent was obtained for treatment procedures.   Mechanical debridement of nails 1-5  bilaterally performed with a nail nipper.  Filed with dremel without incident.     Return office visit   3 months                  Told patient to return for periodic foot care and evaluation due to potential at risk complications.   Helane Gunther DPM

## 2023-08-14 ENCOUNTER — Other Ambulatory Visit: Payer: Self-pay

## 2023-08-14 ENCOUNTER — Other Ambulatory Visit (HOSPITAL_COMMUNITY): Payer: Self-pay

## 2023-08-14 MED ORDER — FREESTYLE LIBRE 3 SENSOR MISC
1.0000 | 0 refills | Status: AC
Start: 2023-08-13 — End: ?
  Filled 2023-08-14: qty 6, 84d supply, fill #0

## 2023-08-15 ENCOUNTER — Other Ambulatory Visit (HOSPITAL_COMMUNITY): Payer: Self-pay

## 2023-08-22 ENCOUNTER — Other Ambulatory Visit (HOSPITAL_COMMUNITY): Payer: Self-pay

## 2023-08-24 IMAGING — US US EXTREM LOW VENOUS*R*
1 series · 13 of 24 positions shown · non-contrast
Comparison: None.

CLINICAL DATA: Acute deep vein thrombosis (DVT) of right lower
extremity, unspecified vein (HCC)



[Series 1: us venous img lower uni right (dvt) · portal-venous · 13 of 40 slices shown]
[im 1/40]
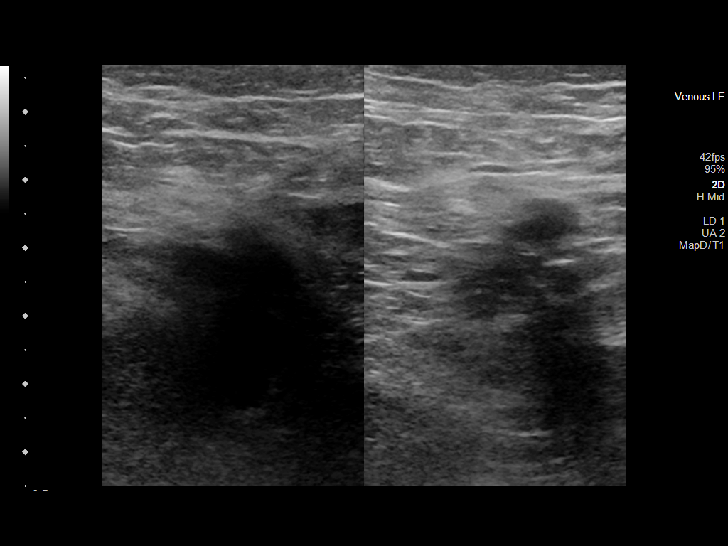
[im 4/40]
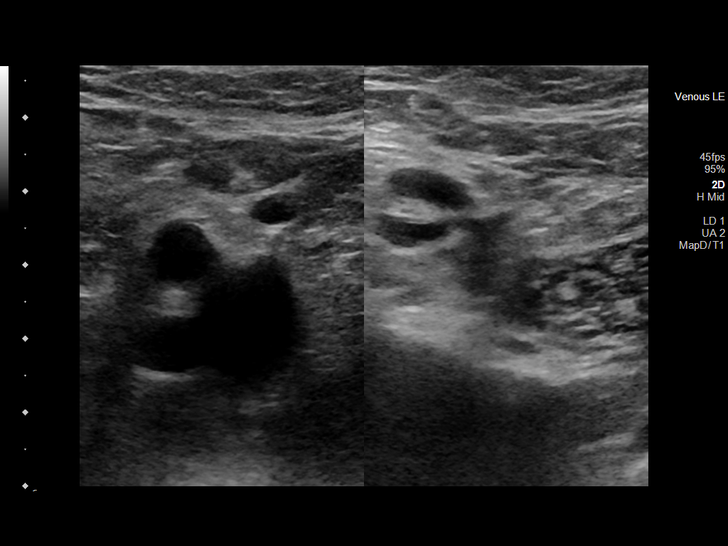
[im 7/40]
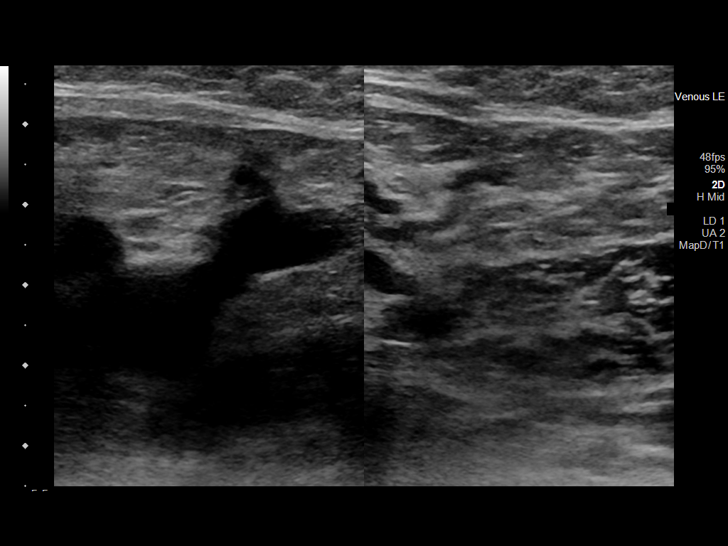
[im 11/40]
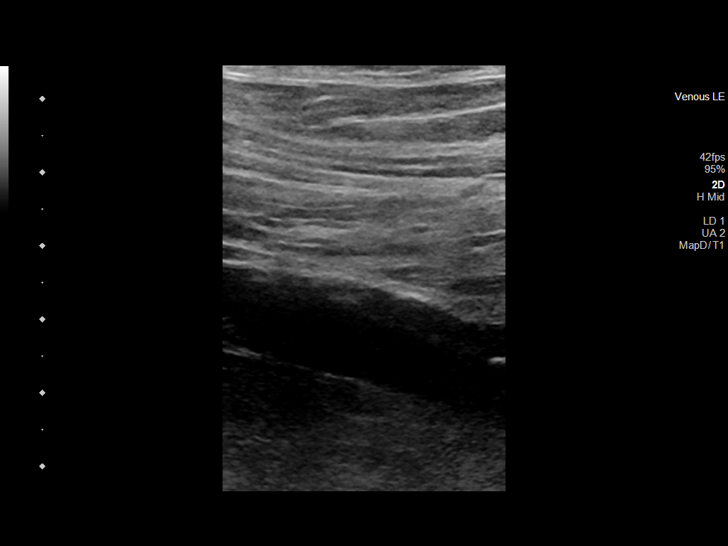
[im 14/40]
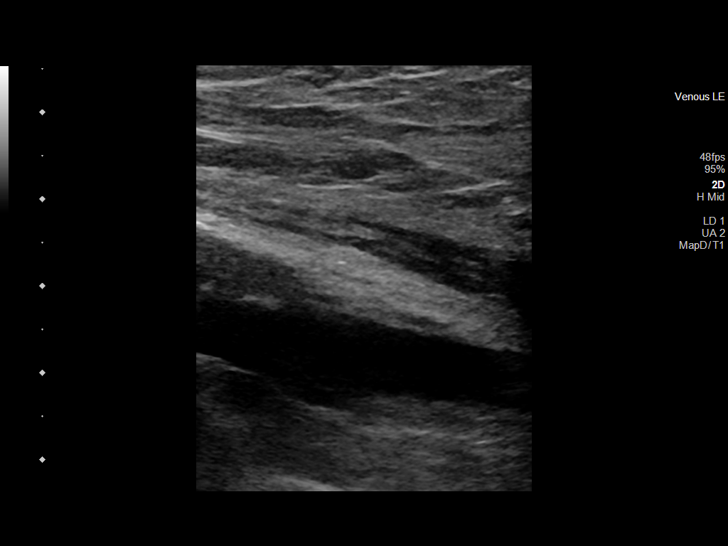
[im 17/40]
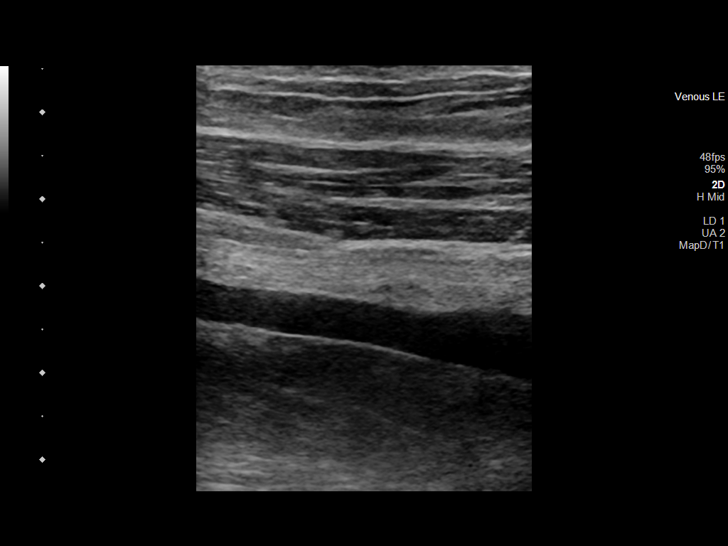
[im 21/40]
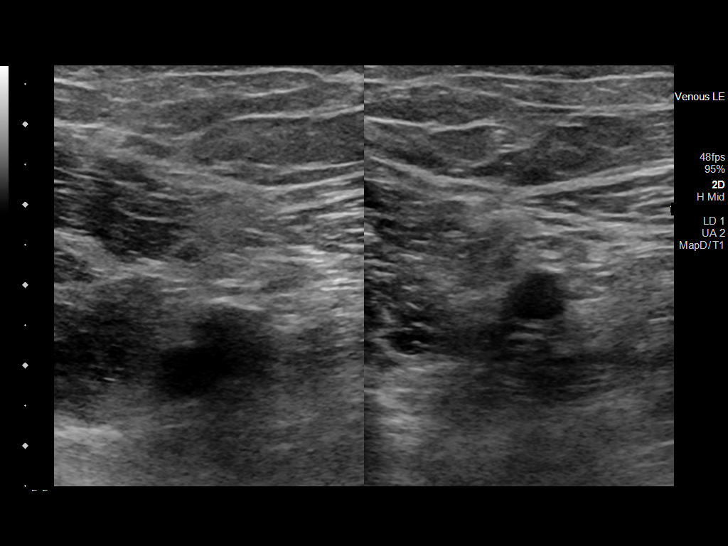
[im 23/40]
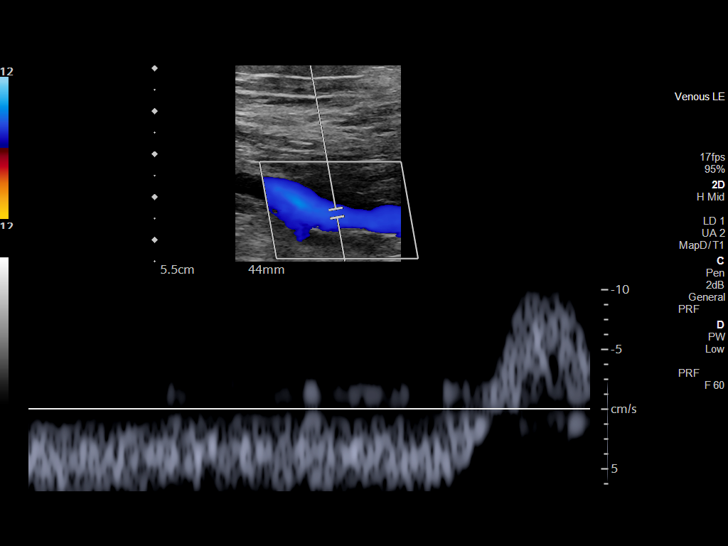
[im 26/40]
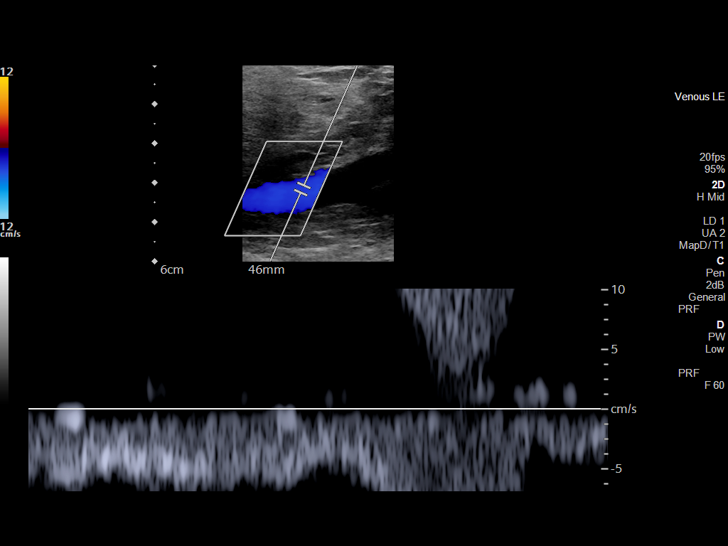
[im 29/40]
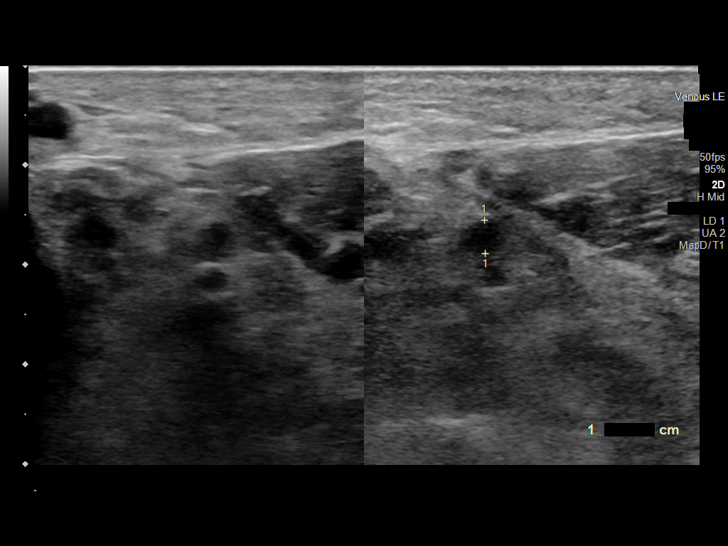
[im 33/40]
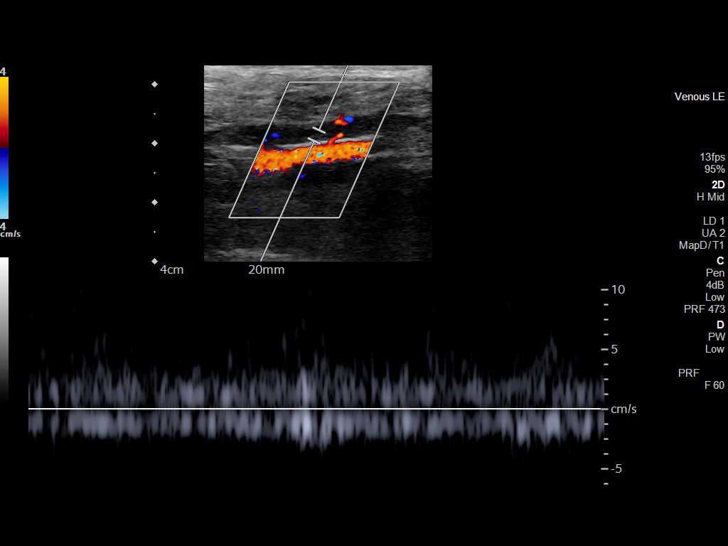
[im 36/40]
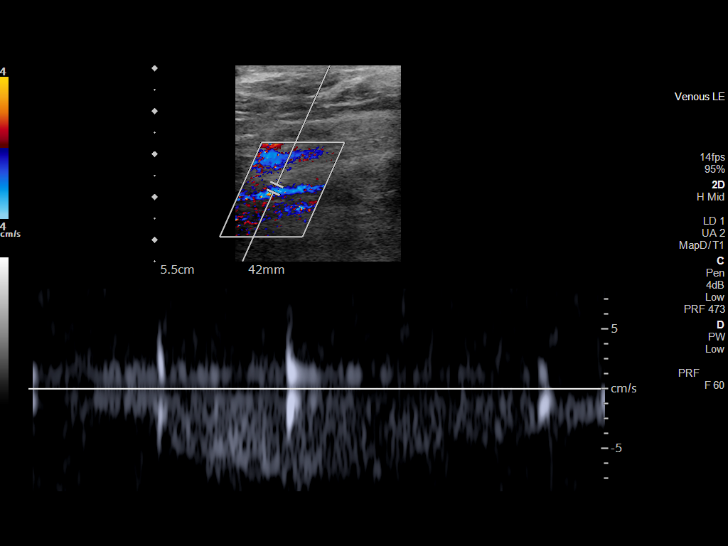
[im 40/40]
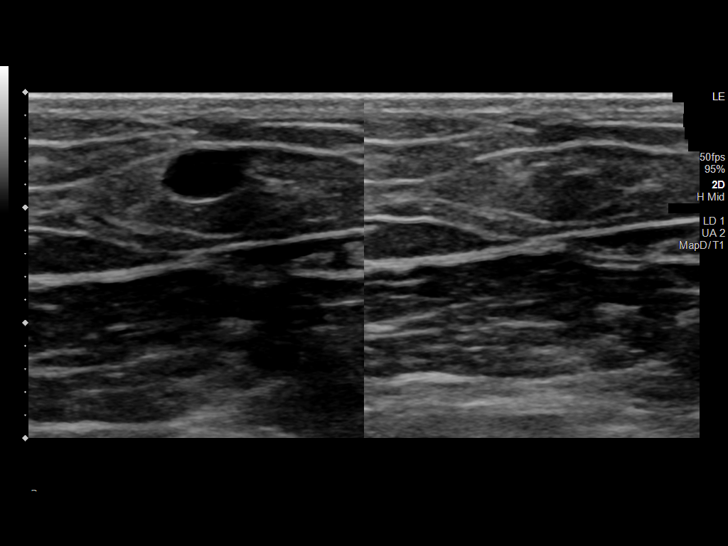

[13 of 24 positions shown; findings below may reference images not displayed]

FINDINGS: Contralateral Common Femoral Vein: Respiratory phasicity is normal
and symmetric with the symptomatic side. No evidence of thrombus.
Normal compressibility.

Common Femoral Vein: No evidence of thrombus. Normal
compressibility, respiratory phasicity and response to augmentation.

Saphenofemoral Junction: No evidence of thrombus. Normal
compressibility and flow on color Doppler imaging.

Profunda Femoral Vein: Occlusive thrombus noted.

Femoral Vein: No evidence of thrombus. Normal compressibility,
respiratory phasicity and response to augmentation.

Popliteal Vein: No evidence of thrombus. Normal compressibility,
respiratory phasicity and response to augmentation.

Calf Veins: Thrombus seen in 1 of the 2 posterior tibial veins.

Superficial Great Saphenous Vein: No evidence of thrombus. Normal
compressibility.

Venous Reflux:  None.

Other Findings:  None.
IMPRESSION: Improved right lower extremity DVT with residual thrombus seen
within the profunda femoral vein and 1 of the 2 posterior tibial
veins in the right calf.

## 2023-08-28 ENCOUNTER — Telehealth (INDEPENDENT_AMBULATORY_CARE_PROVIDER_SITE_OTHER): Payer: Self-pay | Admitting: Otolaryngology

## 2023-08-28 NOTE — Telephone Encounter (Signed)
 Left vm to confirm appt and address for 08/29/2023.

## 2023-08-29 ENCOUNTER — Encounter (INDEPENDENT_AMBULATORY_CARE_PROVIDER_SITE_OTHER): Payer: Self-pay

## 2023-08-29 ENCOUNTER — Ambulatory Visit (INDEPENDENT_AMBULATORY_CARE_PROVIDER_SITE_OTHER): Payer: PPO | Admitting: Otolaryngology

## 2023-08-29 VITALS — BP 112/66 | HR 76 | Wt 237.0 lb

## 2023-08-29 DIAGNOSIS — J309 Allergic rhinitis, unspecified: Secondary | ICD-10-CM

## 2023-08-29 DIAGNOSIS — R0982 Postnasal drip: Secondary | ICD-10-CM

## 2023-08-29 DIAGNOSIS — R0981 Nasal congestion: Secondary | ICD-10-CM

## 2023-08-29 DIAGNOSIS — J343 Hypertrophy of nasal turbinates: Secondary | ICD-10-CM | POA: Diagnosis not present

## 2023-08-29 NOTE — Progress Notes (Unsigned)
 Patient ID: Eric Campbell, male   DOB: 01-28-1951, 73 y.o.   MRN: [id number]  Follow-up: Chronic nasal drainage, chronic nasal congestion  HPI: The patient is a 73 year old male who returns today for his follow-up evaluation.  The patient was last seen in January 2025.  At that time, he was complaining of chronic nasal congestion and nasal drainage.  He was noted to have nasal mucosal congestion, bilateral inferior turbinate hypertrophy, and postnasal drainage.  He was treated with Flonase, azelastine, and Allegra.  According to the patient, his nasal drainage has improved with the azelastine nasal spray.  However, he is still having occasional nasal congestion and coughing spells.  He is using Tessalon Perles to treat his cough.  He denies any facial pain, fever, or visual change.  Exam: General: Communicates without difficulty, well nourished, no acute distress. Head: Normocephalic, no evidence injury, no tenderness, facial buttresses intact without stepoff. Face/sinus: No tenderness to palpation and percussion. Facial movement is normal and symmetric. Eyes: PERRL, EOMI. No scleral icterus, conjunctivae clear. Neuro: CN II exam reveals vision grossly intact.  No nystagmus at any point of gaze. Ears: Auricles well formed without lesions.  Ear canals are intact without mass or lesion.  No erythema or edema is appreciated.  The TMs are intact without fluid. Nose: External evaluation reveals normal support and skin without lesions.  Dorsum is intact.  Anterior rhinoscopy reveals congested mucosa over anterior aspect of inferior turbinates and intact septum.  No purulence noted. Oral:  Oral cavity and oropharynx are intact, symmetric, without erythema or edema.  Mucosa is moist without lesions. Neck: Full range of motion without pain.  There is no significant lymphadenopathy.  No masses palpable.  Thyroid bed within normal limits to palpation.  Parotid glands and submandibular glands equal bilaterally without  mass.  Trachea is midline. Neuro:  CN 2-12 grossly intact.   Assessment: 1.  Chronic/allergic rhinitis, with nasal mucosal congestion and bilateral inferior turbinate hypertrophy.  His nasal congestion has mildly improved with the use of allergy medications. 2.  His postnasal drainage is also improved with the use of azelastine. 3.  No acute infection is noted today.  Plan: 1.  The physical exam findings are reviewed with the patient. 2.  Continue with Flonase, azelastine, and Allegra. 3.  Nasal saline irrigation is encouraged. 4.  The patient is interested in an allergy referral to further evaluate and treatment of his allergic symptoms.  A referral will be arranged as soon as possible.

## 2023-09-04 ENCOUNTER — Other Ambulatory Visit (HOSPITAL_COMMUNITY): Payer: Self-pay

## 2023-09-24 ENCOUNTER — Other Ambulatory Visit (HOSPITAL_COMMUNITY): Payer: Self-pay

## 2023-09-24 ENCOUNTER — Other Ambulatory Visit: Payer: Self-pay

## 2023-09-24 MED ORDER — ATORVASTATIN CALCIUM 20 MG PO TABS
20.0000 mg | ORAL_TABLET | Freq: Every day | ORAL | 0 refills | Status: DC
  Filled 2023-09-24: qty 90, 90d supply, fill #0

## 2023-09-26 ENCOUNTER — Other Ambulatory Visit: Payer: Self-pay

## 2023-09-26 ENCOUNTER — Other Ambulatory Visit (HOSPITAL_COMMUNITY): Payer: Self-pay

## 2023-10-03 ENCOUNTER — Other Ambulatory Visit (HOSPITAL_COMMUNITY): Payer: Self-pay

## 2023-10-03 DIAGNOSIS — J309 Allergic rhinitis, unspecified: Secondary | ICD-10-CM | POA: Diagnosis not present

## 2023-10-03 DIAGNOSIS — E1169 Type 2 diabetes mellitus with other specified complication: Secondary | ICD-10-CM | POA: Diagnosis not present

## 2023-10-03 DIAGNOSIS — M48062 Spinal stenosis, lumbar region with neurogenic claudication: Secondary | ICD-10-CM | POA: Diagnosis not present

## 2023-10-03 DIAGNOSIS — R790 Abnormal level of blood mineral: Secondary | ICD-10-CM | POA: Diagnosis not present

## 2023-10-03 DIAGNOSIS — I2699 Other pulmonary embolism without acute cor pulmonale: Secondary | ICD-10-CM | POA: Diagnosis not present

## 2023-10-03 DIAGNOSIS — I10 Essential (primary) hypertension: Secondary | ICD-10-CM | POA: Diagnosis not present

## 2023-10-03 DIAGNOSIS — E782 Mixed hyperlipidemia: Secondary | ICD-10-CM | POA: Diagnosis not present

## 2023-10-03 DIAGNOSIS — J45909 Unspecified asthma, uncomplicated: Secondary | ICD-10-CM | POA: Diagnosis not present

## 2023-10-03 DIAGNOSIS — E291 Testicular hypofunction: Secondary | ICD-10-CM | POA: Diagnosis not present

## 2023-10-03 DIAGNOSIS — E559 Vitamin D deficiency, unspecified: Secondary | ICD-10-CM | POA: Diagnosis not present

## 2023-10-03 DIAGNOSIS — M546 Pain in thoracic spine: Secondary | ICD-10-CM | POA: Diagnosis not present

## 2023-10-03 DIAGNOSIS — G5622 Lesion of ulnar nerve, left upper limb: Secondary | ICD-10-CM | POA: Diagnosis not present

## 2023-10-03 MED ORDER — LOSARTAN POTASSIUM 100 MG PO TABS
100.0000 mg | ORAL_TABLET | Freq: Every day | ORAL | 1 refills | Status: DC
  Filled 2023-12-31: qty 90, 90d supply, fill #0
  Filled 2024-05-07: qty 90, 90d supply, fill #1

## 2023-10-03 MED ORDER — ATORVASTATIN CALCIUM 20 MG PO TABS
20.0000 mg | ORAL_TABLET | Freq: Every day | ORAL | 1 refills | Status: DC
  Filled 2023-12-31: qty 90, 90d supply, fill #0
  Filled 2024-05-07: qty 90, 90d supply, fill #1

## 2023-10-03 MED ORDER — METFORMIN HCL 1000 MG PO TABS
1000.0000 mg | ORAL_TABLET | Freq: Two times a day (BID) | ORAL | 1 refills | Status: AC
  Filled 2023-12-31: qty 180, 90d supply, fill #0

## 2023-10-09 ENCOUNTER — Other Ambulatory Visit (HOSPITAL_COMMUNITY): Payer: Self-pay

## 2023-10-14 ENCOUNTER — Other Ambulatory Visit (HOSPITAL_COMMUNITY): Payer: Self-pay

## 2023-10-15 ENCOUNTER — Other Ambulatory Visit: Payer: Self-pay

## 2023-10-15 ENCOUNTER — Other Ambulatory Visit (HOSPITAL_COMMUNITY): Payer: Self-pay

## 2023-10-15 MED ORDER — OZEMPIC (2 MG/DOSE) 8 MG/3ML ~~LOC~~ SOPN
2.0000 mg | PEN_INJECTOR | SUBCUTANEOUS | 1 refills | Status: DC
  Filled 2023-10-15: qty 9, 84d supply, fill #0
  Filled 2023-12-31: qty 9, 84d supply, fill #1

## 2023-10-16 ENCOUNTER — Other Ambulatory Visit (HOSPITAL_COMMUNITY): Payer: Self-pay

## 2023-10-21 ENCOUNTER — Other Ambulatory Visit (HOSPITAL_COMMUNITY): Payer: Self-pay

## 2023-10-24 ENCOUNTER — Ambulatory Visit
Admission: RE | Admit: 2023-10-24 | Discharge: 2023-10-24 | Disposition: A | Discharge status: Home / Self Care | Payer: PPO | Source: Ambulatory Visit | Attending: Family Medicine | Admitting: Family Medicine

## 2023-10-24 DIAGNOSIS — M85852 Other specified disorders of bone density and structure, left thigh: Secondary | ICD-10-CM

## 2023-10-24 DIAGNOSIS — M8588 Other specified disorders of bone density and structure, other site: Secondary | ICD-10-CM | POA: Diagnosis not present

## 2023-11-05 ENCOUNTER — Ambulatory Visit: Admitting: Allergy & Immunology

## 2023-11-05 ENCOUNTER — Encounter: Payer: Self-pay | Admitting: Allergy & Immunology

## 2023-11-05 ENCOUNTER — Other Ambulatory Visit: Payer: Self-pay

## 2023-11-05 VITALS — BP 120/74 | HR 71 | Temp 97.3°F | Resp 17 | Ht 68.5 in | Wt 239.2 lb

## 2023-11-05 DIAGNOSIS — R918 Other nonspecific abnormal finding of lung field: Secondary | ICD-10-CM | POA: Diagnosis not present

## 2023-11-05 DIAGNOSIS — G4733 Obstructive sleep apnea (adult) (pediatric): Secondary | ICD-10-CM | POA: Diagnosis not present

## 2023-11-05 DIAGNOSIS — J310 Chronic rhinitis: Secondary | ICD-10-CM

## 2023-11-05 DIAGNOSIS — J4540 Moderate persistent asthma, uncomplicated: Secondary | ICD-10-CM

## 2023-11-05 NOTE — Patient Instructions (Addendum)
 1. Chronic rhinitis - We are going to get some blood work to look for evidence of environmental allergies. - We may follow up with skin testing is needed.  - We can come up with a better plan at that point.  2. Moderate persistent asthma, uncomplicated - Lung testing looked pretty good. - I would just continue with at least one puff of Symbicort  once daily to keep inflammation in the lungs under better control. - Daily controller medication(s): Symbicort  160/4.61mcg ONE PUFF ONCE DAILY daily - Prior to physical activity: albuterol  2 puffs 10-15 minutes before physical activity. - Rescue medications: albuterol  4 puffs every 4-6 hours as needed - Changes during respiratory infections or worsening symptoms: Increase Symbicort  to 2 puffs twice daily for TWO WEEKS. - Asthma control goals:  * Full participation in all desired activities (may need albuterol  before activity) * Albuterol  use two time or less a week on average (not counting use with activity) * Cough interfering with sleep two time or less a month * Oral steroids no more than once a year * No hospitalizations  3. Pulmonary nodules - This seems to have stabilized.   4. OSA (obstructive sleep apnea) - Continue with your CPAP machine. - Clearly this is helping with your symptoms.   5. Return in about 1 week (around 11/12/2023). You can have the follow up appointment with Dr. Idolina Maker or a Nurse Practicioner (our Nurse Practitioners are excellent and always have Physician oversight!).    Please inform us  of any Emergency Department visits, hospitalizations, or changes in symptoms. Call us  before going to the ED for breathing or allergy symptoms since we might be able to fit you in for a sick visit. Feel free to contact us  anytime with any questions, problems, or concerns.  It was a pleasure to meet you today!  Websites that have reliable patient information: 1. American Academy of Asthma, Allergy, and Immunology: www.aaaai.org 2.  Food Allergy Research and Education (FARE): foodallergy.org 3. Mothers of Asthmatics: http://www.asthmacommunitynetwork.org 4. American College of Allergy, Asthma, and Immunology: www.acaai.org      "Like" us  on Facebook and Instagram for our latest updates!      A healthy democracy works best when [employer] participate! Make sure you are registered to vote! If you have moved or changed any of your contact information, you will need to get this updated before voting! Scan the QR codes below to learn more!
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
## 2023-11-08 ENCOUNTER — Encounter: Payer: Self-pay | Admitting: Allergy & Immunology

## 2023-11-08 LAB — ALLERGEN PROFILE, MOLD
Aureobasidi Pullulans IgE: <0.1 kU/L
Candida Albicans IgE: <0.1 kU/L
M009-IgE Fusarium proliferatum: <0.1 kU/L
M014-IgE Epicoccum purpur: <0.1 kU/L
Mucor Racemosus IgE: <0.1 kU/L
Phoma Betae IgE: <0.1 kU/L
Setomelanomma Rostrat: <0.1 kU/L
Stemphylium Herbarum IgE: <0.1 kU/L

## 2023-11-08 LAB — ALLERGENS W/COMP RFLX AREA 2
Alternaria Alternata IgE: <0.1 kU/L
Aspergillus Fumigatus IgE: <0.1 kU/L
Bermuda Grass IgE: <0.1 kU/L
Cedar, Mountain IgE: <0.1 kU/L
Cladosporium Herbarum IgE: <0.1 kU/L
Cockroach, German IgE: <0.1 kU/L
Common Silver Birch IgE: <0.1 kU/L
Cottonwood IgE: <0.1 kU/L
D Farinae IgE: <0.1 kU/L
D Pteronyssinus IgE: <0.1 kU/L
E001-IgE Cat Dander: <0.1 kU/L
E005-IgE Dog Dander: <0.1 kU/L
Elm, American IgE: <0.1 kU/L
IgE (Immunoglobulin E), Serum: 84 [IU]/mL (ref 6–495)
Johnson Grass IgE: <0.1 kU/L
Maple/Box Elder IgE: <0.1 kU/L
Mouse Urine IgE: <0.1 kU/L
Oak, White IgE: <0.1 kU/L
Pecan, Hickory IgE: <0.1 kU/L
Penicillium Chrysogen IgE: <0.1 kU/L
Pigweed, Rough IgE: <0.1 kU/L
Ragweed, Short IgE: <0.1 kU/L
Sheep Sorrel IgE Qn: <0.1 kU/L
Timothy Grass IgE: <0.1 kU/L
White Mulberry IgE: <0.1 kU/L

## 2023-11-11 ENCOUNTER — Other Ambulatory Visit (HOSPITAL_COMMUNITY): Payer: Self-pay

## 2023-11-12 ENCOUNTER — Encounter: Payer: Self-pay | Admitting: Podiatry

## 2023-11-12 ENCOUNTER — Ambulatory Visit (INDEPENDENT_AMBULATORY_CARE_PROVIDER_SITE_OTHER): Payer: PPO | Admitting: Podiatry

## 2023-11-12 DIAGNOSIS — M79676 Pain in unspecified toe(s): Secondary | ICD-10-CM

## 2023-11-12 DIAGNOSIS — B351 Tinea unguium: Secondary | ICD-10-CM

## 2023-11-12 DIAGNOSIS — E1159 Type 2 diabetes mellitus with other circulatory complications: Secondary | ICD-10-CM

## 2023-11-12 NOTE — Progress Notes (Signed)
 This patient returns to my office for at risk foot care.  This patient requires this care by a professional since this patient will be at risk due to having type 2 diabetes and leg swelling.  This patient is unable to cut nails himself since the patient cannot reach his nails.These nails are painful walking and wearing shoes.  This patient presents for at risk foot care today.  General Appearance  Alert, conversant and in no acute stress.  Vascular  Dorsalis pedis and posterior tibial  pulses are palpable  left foot.  Dorsalis pedis and posterior tibial pulses are weakly palpable due to leg swelling..  Capillary return is within normal limits  bilaterally. Temperature is within normal limits  bilaterally.  Neurologic  Senn-Weinstein monofilament wire test within normal limits  bilaterally. Muscle power within normal limits bilaterally.  Nails Thick disfigured discolored nails with subungual debris  from hallux to fifth toes bilaterally. No evidence of bacterial infection or drainage bilaterally.  Orthopedic  No limitations of motion  feet .  No crepitus or effusions noted.  No bony pathology or digital deformities noted.  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  Skin  normotropic skin with no porokeratosis noted bilaterally.  No signs of infections or ulcers noted.  Healing skin lesion with scab plantarly.  No infection or drainage.  Onychomycosis  Pain in right toes  Pain in left toes  Consent was obtained for treatment procedures.   Mechanical debridement of nails 1-5  bilaterally performed with a nail nipper.  Filed with dremel without incident.     Return office visit   3 months                  Told patient to return for periodic foot care and evaluation due to potential at risk complications.   Helane Gunther DPM

## 2023-11-21 ENCOUNTER — Other Ambulatory Visit (HOSPITAL_COMMUNITY): Payer: Self-pay

## 2023-11-21 MED ORDER — FLUTICASONE PROPIONATE 0.05 % EX CREA
TOPICAL_CREAM | Freq: Two times a day (BID) | CUTANEOUS | 3 refills | Status: AC | PRN
  Filled 2023-11-21: qty 60, 30d supply, fill #0
  Filled 2024-03-06: qty 60, 30d supply, fill #1

## 2023-11-22 ENCOUNTER — Other Ambulatory Visit (HOSPITAL_COMMUNITY): Payer: Self-pay

## 2023-11-22 DIAGNOSIS — G4733 Obstructive sleep apnea (adult) (pediatric): Secondary | ICD-10-CM | POA: Diagnosis not present

## 2023-11-22 MED ORDER — FREESTYLE LIBRE 3 SENSOR MISC
0 refills | Status: AC
  Filled 2023-11-22: qty 6, 84d supply, fill #0

## 2023-11-26 ENCOUNTER — Ambulatory Visit: Admitting: Allergy & Immunology

## 2023-11-27 ENCOUNTER — Other Ambulatory Visit (HOSPITAL_COMMUNITY): Payer: Self-pay

## 2023-11-27 MED ORDER — FREESTYLE LIBRE 3 PLUS SENSOR MISC
1.0000 | 1 refills | Status: AC
  Filled 2023-11-27 – 2024-02-12 (×2): qty 6, 90d supply, fill #0
  Filled 2024-05-07: qty 6, 90d supply, fill #1

## 2023-11-28 ENCOUNTER — Ambulatory Visit (INDEPENDENT_AMBULATORY_CARE_PROVIDER_SITE_OTHER): Payer: PPO | Admitting: Otolaryngology

## 2023-12-02 ENCOUNTER — Other Ambulatory Visit (HOSPITAL_COMMUNITY): Payer: Self-pay

## 2023-12-09 ENCOUNTER — Other Ambulatory Visit (HOSPITAL_COMMUNITY): Payer: Self-pay

## 2023-12-17 DIAGNOSIS — X32XXXD Exposure to sunlight, subsequent encounter: Secondary | ICD-10-CM | POA: Diagnosis not present

## 2023-12-17 DIAGNOSIS — D225 Melanocytic nevi of trunk: Secondary | ICD-10-CM | POA: Diagnosis not present

## 2023-12-17 DIAGNOSIS — L570 Actinic keratosis: Secondary | ICD-10-CM | POA: Diagnosis not present

## 2023-12-31 ENCOUNTER — Other Ambulatory Visit (HOSPITAL_COMMUNITY): Payer: Self-pay

## 2024-01-22 DIAGNOSIS — M5416 Radiculopathy, lumbar region: Secondary | ICD-10-CM | POA: Diagnosis not present

## 2024-01-22 DIAGNOSIS — E1169 Type 2 diabetes mellitus with other specified complication: Secondary | ICD-10-CM | POA: Diagnosis not present

## 2024-01-22 DIAGNOSIS — H539 Unspecified visual disturbance: Secondary | ICD-10-CM | POA: Diagnosis not present

## 2024-01-23 ENCOUNTER — Other Ambulatory Visit: Payer: Self-pay | Admitting: Family Medicine

## 2024-01-23 DIAGNOSIS — M5416 Radiculopathy, lumbar region: Secondary | ICD-10-CM

## 2024-01-25 ENCOUNTER — Ambulatory Visit
Admission: RE | Admit: 2024-01-25 | Discharge: 2024-01-25 | Disposition: A | Discharge status: Home / Self Care | Source: Ambulatory Visit | Attending: Family Medicine | Admitting: Family Medicine

## 2024-01-25 DIAGNOSIS — M5416 Radiculopathy, lumbar region: Secondary | ICD-10-CM

## 2024-01-25 DIAGNOSIS — M48061 Spinal stenosis, lumbar region without neurogenic claudication: Secondary | ICD-10-CM | POA: Diagnosis not present

## 2024-01-27 DIAGNOSIS — M7582 Other shoulder lesions, left shoulder: Secondary | ICD-10-CM | POA: Diagnosis not present

## 2024-02-06 ENCOUNTER — Encounter: Payer: Self-pay | Admitting: Podiatry

## 2024-02-06 ENCOUNTER — Ambulatory Visit (INDEPENDENT_AMBULATORY_CARE_PROVIDER_SITE_OTHER): Admitting: Podiatry

## 2024-02-06 DIAGNOSIS — M79676 Pain in unspecified toe(s): Secondary | ICD-10-CM | POA: Diagnosis not present

## 2024-02-06 DIAGNOSIS — T148XXA Other injury of unspecified body region, initial encounter: Secondary | ICD-10-CM | POA: Diagnosis not present

## 2024-02-06 DIAGNOSIS — B351 Tinea unguium: Secondary | ICD-10-CM | POA: Diagnosis not present

## 2024-02-06 DIAGNOSIS — E1159 Type 2 diabetes mellitus with other circulatory complications: Secondary | ICD-10-CM | POA: Diagnosis not present

## 2024-02-06 NOTE — Progress Notes (Signed)
 This patient returns to my office for at risk foot care.  This patient requires this care by a professional since this patient will be at risk due to having type 2 diabetes and leg swelling.  This patient is unable to cut nails himself since the patient cannot reach his nails.These nails are painful walking and wearing shoes.   Patient says he pulled skin from under his right big toe and has developed an open wound.  He has been treating it at home with neosporin and bandage. No drainage or infection from skin wound.This patient presents for at risk foot care today.  General Appearance  Alert, conversant and in no acute stress.  Vascular  Dorsalis pedis and posterior tibial  pulses are palpable  left foot.  Dorsalis pedis and posterior tibial pulses are weakly palpable right foot  due to leg swelling..  Capillary return is within normal limits  bilaterally. Temperature is within normal limits  bilaterally.  Neurologic  Senn-Weinstein monofilament wire test within normal limits  bilaterally. Muscle power within normal limits bilaterally.  Nails Thick disfigured discolored nails with subungual debris  from hallux to fifth toes bilaterally. No evidence of bacterial infection or drainage bilaterally.  Orthopedic  No limitations of motion  feet .  No crepitus or effusions noted.  No bony pathology or digital deformities noted.  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  Skin  normotropic skin with no porokeratosis noted bilaterally.  No signs of infections or ulcers noted.  Open wound sulcus right hallux.    No infection or drainage.  Onychomycosis  Pain in right toes  Pain in left toes  Open wound right hallux.  Consent was obtained for treatment procedures.   Mechanical debridement of nails 1-5  bilaterally performed with a nail nipper.  Filed with dremel without incident.  Discussed open wound with this patient,  Neosporin/DSD. Call the office if problem persists   Return office visit   9 weeks.                  Told patient to return for periodic foot care and evaluation due to potential at risk complications.   Cordella Bold DPM  tomma

## 2024-02-10 ENCOUNTER — Other Ambulatory Visit

## 2024-02-11 ENCOUNTER — Ambulatory Visit: Admitting: Podiatry

## 2024-02-11 DIAGNOSIS — M7582 Other shoulder lesions, left shoulder: Secondary | ICD-10-CM | POA: Diagnosis not present

## 2024-02-12 ENCOUNTER — Other Ambulatory Visit: Payer: Self-pay

## 2024-02-12 ENCOUNTER — Other Ambulatory Visit (HOSPITAL_COMMUNITY): Payer: Self-pay

## 2024-02-14 DIAGNOSIS — E1169 Type 2 diabetes mellitus with other specified complication: Secondary | ICD-10-CM | POA: Diagnosis not present

## 2024-02-14 DIAGNOSIS — M48062 Spinal stenosis, lumbar region with neurogenic claudication: Secondary | ICD-10-CM | POA: Diagnosis not present

## 2024-02-14 DIAGNOSIS — R197 Diarrhea, unspecified: Secondary | ICD-10-CM | POA: Diagnosis not present

## 2024-02-24 DIAGNOSIS — M7582 Other shoulder lesions, left shoulder: Secondary | ICD-10-CM | POA: Diagnosis not present

## 2024-02-27 ENCOUNTER — Other Ambulatory Visit: Payer: Self-pay

## 2024-02-27 ENCOUNTER — Other Ambulatory Visit (HOSPITAL_COMMUNITY): Payer: Self-pay

## 2024-02-27 MED ORDER — METFORMIN HCL ER 500 MG PO TB24
500.0000 mg | ORAL_TABLET | Freq: Every evening | ORAL | 1 refills | Status: AC
  Filled 2024-02-27: qty 30, 30d supply, fill #0

## 2024-03-06 ENCOUNTER — Other Ambulatory Visit (HOSPITAL_COMMUNITY): Payer: Self-pay

## 2024-03-13 ENCOUNTER — Other Ambulatory Visit (HOSPITAL_COMMUNITY): Payer: Self-pay

## 2024-03-13 MED ORDER — METFORMIN HCL ER 500 MG PO TB24
500.0000 mg | ORAL_TABLET | Freq: Two times a day (BID) | ORAL | 2 refills | Status: AC
  Filled 2024-03-18: qty 60, 30d supply, fill #0

## 2024-03-18 ENCOUNTER — Other Ambulatory Visit: Payer: Self-pay

## 2024-03-18 ENCOUNTER — Other Ambulatory Visit (HOSPITAL_COMMUNITY): Payer: Self-pay

## 2024-03-18 MED ORDER — OZEMPIC (2 MG/DOSE) 8 MG/3ML ~~LOC~~ SOPN
2.0000 mg | PEN_INJECTOR | SUBCUTANEOUS | 1 refills | Status: AC
  Filled 2024-03-18: qty 9, 84d supply, fill #0

## 2024-03-23 ENCOUNTER — Other Ambulatory Visit (HOSPITAL_COMMUNITY): Payer: Self-pay

## 2024-04-09 ENCOUNTER — Encounter: Payer: Self-pay | Admitting: Podiatry

## 2024-04-09 ENCOUNTER — Ambulatory Visit (INDEPENDENT_AMBULATORY_CARE_PROVIDER_SITE_OTHER): Admitting: Podiatry

## 2024-04-09 DIAGNOSIS — E1159 Type 2 diabetes mellitus with other circulatory complications: Secondary | ICD-10-CM

## 2024-04-09 DIAGNOSIS — M79676 Pain in unspecified toe(s): Secondary | ICD-10-CM

## 2024-04-09 DIAGNOSIS — B351 Tinea unguium: Secondary | ICD-10-CM | POA: Diagnosis not present

## 2024-04-09 NOTE — Progress Notes (Signed)
 This patient returns to my office for at risk foot care.  This patient requires this care by a professional since this patient will be at risk due to having type 2 diabetes and leg swelling.  This patient is unable to cut nails himself since the patient cannot reach his nails.These nails are painful walking and wearing shoes.   This patient presents for at risk foot care today.  General Appearance  Alert, conversant and in no acute stress.  Vascular  Dorsalis pedis and posterior tibial  pulses are palpable  left foot.  Dorsalis pedis and posterior tibial pulses are weakly palpable right foot  due to leg swelling..  Capillary return is within normal limits  bilaterally. Temperature is within normal limits  bilaterally.  Neurologic  Senn-Weinstein monofilament wire test within normal limits  bilaterally. Muscle power within normal limits bilaterally.  Nails Thick disfigured discolored nails with subungual debris  from hallux to fifth toes bilaterally. No evidence of bacterial infection or drainage bilaterally.  Orthopedic  No limitations of motion  feet .  No crepitus or effusions noted.  No bony pathology or digital deformities noted.  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  Skin  normotropic skin with no porokeratosis noted bilaterally.  No signs of infections or ulcers noted.     No infection or drainage.  Onychomycosis  Pain in right toes  Pain in left toes    Consent was obtained for treatment procedures.   Mechanical debridement of nails 1-5  bilaterally performed with a nail nipper.  Filed with dremel without incident.  RTC 3 months    Return office visit   3 months                 Told patient to return for periodic foot care and evaluation due to potential at risk complications.   Cordella Bold DPM  tomma

## 2024-04-13 ENCOUNTER — Ambulatory Visit: Admitting: Podiatry

## 2024-05-07 ENCOUNTER — Other Ambulatory Visit (HOSPITAL_COMMUNITY): Payer: Self-pay

## 2024-05-15 ENCOUNTER — Other Ambulatory Visit: Payer: Self-pay | Admitting: Family Medicine

## 2024-05-15 ENCOUNTER — Other Ambulatory Visit (HOSPITAL_COMMUNITY): Payer: Self-pay

## 2024-05-15 DIAGNOSIS — Z1331 Encounter for screening for depression: Secondary | ICD-10-CM | POA: Diagnosis not present

## 2024-05-15 DIAGNOSIS — E1169 Type 2 diabetes mellitus with other specified complication: Secondary | ICD-10-CM | POA: Diagnosis not present

## 2024-05-15 DIAGNOSIS — G4730 Sleep apnea, unspecified: Secondary | ICD-10-CM | POA: Diagnosis not present

## 2024-05-15 DIAGNOSIS — M48062 Spinal stenosis, lumbar region with neurogenic claudication: Secondary | ICD-10-CM | POA: Diagnosis not present

## 2024-05-15 DIAGNOSIS — Z0000 Encounter for general adult medical examination without abnormal findings: Secondary | ICD-10-CM | POA: Diagnosis not present

## 2024-05-15 DIAGNOSIS — E782 Mixed hyperlipidemia: Secondary | ICD-10-CM | POA: Diagnosis not present

## 2024-05-15 DIAGNOSIS — E559 Vitamin D deficiency, unspecified: Secondary | ICD-10-CM | POA: Diagnosis not present

## 2024-05-15 DIAGNOSIS — I10 Essential (primary) hypertension: Secondary | ICD-10-CM | POA: Diagnosis not present

## 2024-05-15 DIAGNOSIS — I2699 Other pulmonary embolism without acute cor pulmonale: Secondary | ICD-10-CM | POA: Diagnosis not present

## 2024-05-15 DIAGNOSIS — G5622 Lesion of ulnar nerve, left upper limb: Secondary | ICD-10-CM | POA: Diagnosis not present

## 2024-05-15 DIAGNOSIS — M25512 Pain in left shoulder: Secondary | ICD-10-CM

## 2024-05-15 DIAGNOSIS — E291 Testicular hypofunction: Secondary | ICD-10-CM | POA: Diagnosis not present

## 2024-05-15 DIAGNOSIS — J45909 Unspecified asthma, uncomplicated: Secondary | ICD-10-CM | POA: Diagnosis not present

## 2024-05-15 DIAGNOSIS — R790 Abnormal level of blood mineral: Secondary | ICD-10-CM | POA: Diagnosis not present

## 2024-05-15 MED ORDER — OZEMPIC (2 MG/DOSE) 8 MG/3ML ~~LOC~~ SOPN
2.0000 mg | PEN_INJECTOR | SUBCUTANEOUS | 1 refills | Status: AC
  Filled 2024-05-15 – 2024-06-18 (×5): qty 9, 84d supply, fill #0

## 2024-05-15 MED ORDER — LOSARTAN POTASSIUM 100 MG PO TABS: 100.0000 mg | ORAL_TABLET | Freq: Every day | ORAL | 1 refills | Status: AC

## 2024-05-15 MED ORDER — ATORVASTATIN CALCIUM 20 MG PO TABS: 20.0000 mg | ORAL_TABLET | Freq: Every day | ORAL | 1 refills | Status: AC

## 2024-05-20 ENCOUNTER — Other Ambulatory Visit: Payer: Self-pay

## 2024-05-20 ENCOUNTER — Other Ambulatory Visit (HOSPITAL_COMMUNITY): Payer: Self-pay

## 2024-06-06 ENCOUNTER — Ambulatory Visit
Admission: RE | Admit: 2024-06-06 | Discharge: 2024-06-06 | Disposition: A | Source: Ambulatory Visit | Attending: Family Medicine | Admitting: Family Medicine

## 2024-06-06 DIAGNOSIS — M19012 Primary osteoarthritis, left shoulder: Secondary | ICD-10-CM | POA: Diagnosis not present

## 2024-06-06 DIAGNOSIS — M75122 Complete rotator cuff tear or rupture of left shoulder, not specified as traumatic: Secondary | ICD-10-CM | POA: Diagnosis not present

## 2024-06-06 DIAGNOSIS — M25512 Pain in left shoulder: Secondary | ICD-10-CM

## 2024-06-15 ENCOUNTER — Other Ambulatory Visit (HOSPITAL_COMMUNITY): Payer: Self-pay

## 2024-06-18 ENCOUNTER — Other Ambulatory Visit (HOSPITAL_COMMUNITY): Payer: Self-pay

## 2024-06-23 ENCOUNTER — Other Ambulatory Visit (HOSPITAL_COMMUNITY): Payer: Self-pay

## 2024-07-07 ENCOUNTER — Encounter: Payer: Self-pay | Admitting: Podiatry

## 2024-07-07 ENCOUNTER — Ambulatory Visit (INDEPENDENT_AMBULATORY_CARE_PROVIDER_SITE_OTHER): Admitting: Podiatry

## 2024-07-07 DIAGNOSIS — M79675 Pain in left toe(s): Secondary | ICD-10-CM | POA: Diagnosis not present

## 2024-07-07 DIAGNOSIS — E1159 Type 2 diabetes mellitus with other circulatory complications: Secondary | ICD-10-CM

## 2024-07-07 DIAGNOSIS — T148XXA Other injury of unspecified body region, initial encounter: Secondary | ICD-10-CM

## 2024-07-07 DIAGNOSIS — M79674 Pain in right toe(s): Secondary | ICD-10-CM

## 2024-07-07 DIAGNOSIS — B351 Tinea unguium: Secondary | ICD-10-CM | POA: Diagnosis not present

## 2024-07-07 NOTE — Progress Notes (Signed)
 This patient returns to my office for at risk foot care.  This patient requires this care by a professional since this patient will be at risk due to having type 2 diabetes and leg swelling.  This patient is unable to cut nails himself since the patient cannot reach his nails.These nails are painful walking and wearing shoes.   This patient presents for at risk foot care today.  General Appearance  Alert, conversant and in no acute stress.  Vascular  Dorsalis pedis and posterior tibial  pulses are palpable  left foot.  Dorsalis pedis and posterior tibial pulses are weakly palpable right foot  due to leg swelling..  Capillary return is within normal limits  bilaterally. Temperature is within normal limits  bilaterally.  Neurologic  Senn-Weinstein monofilament wire test diminished  bilaterally. Muscle power within normal limits bilaterally.  Nails Thick disfigured discolored nails with subungual debris  from hallux to fifth toes bilaterally. No evidence of bacterial infection or drainage bilaterally.  Orthopedic  No limitations of motion  feet .  No crepitus or effusions noted.  No bony pathology or digital deformities noted.  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  Skin  normotropic skin with no porokeratosis noted bilaterally.  No signs of infections      Healing ulcer under the right hallux.  No evidence of drainage pus or infection.   Onychomycosis  Pain in right toes  Pain in left toes  Healing ulcer right hallux.  Consent was obtained for treatment procedures.   Mechanical debridement of nails 1-5  bilaterally performed with a nail nipper.  Filed with dremel without incident. Cleaning of necrotic tissue in the open wound right hallux.  Open wound was bandaged with neosporin/DSD and toe cap for right hallux.  Patient was told to peroxide and bandage this open wound at home.  Call the office if the healing open wound worsens. RTC 3 months    Return office visit   3 months                 Told  patient to return for periodic foot care and evaluation due to potential at risk complications.   Cordella Bold DPM  tomma

## 2024-07-13 ENCOUNTER — Ambulatory Visit: Admitting: Podiatry

## 2024-10-07 ENCOUNTER — Ambulatory Visit: Admitting: Podiatry
